# Patient Record
Sex: Male | Born: 1940 | Race: Black or African American | Hispanic: No | Marital: Married | Smoking: Former smoker
Health system: Southern US, Community
[De-identification: ages and names within clinical notes are randomized; demographics above are authoritative.]

## PROBLEM LIST (undated history)

## (undated) DIAGNOSIS — I10 Essential (primary) hypertension: Secondary | ICD-10-CM

## (undated) DIAGNOSIS — E119 Type 2 diabetes mellitus without complications: Secondary | ICD-10-CM

## (undated) HISTORY — PX: CARDIAC PACEMAKER PLACEMENT: SHX583

---

## 2005-05-06 ENCOUNTER — Ambulatory Visit: Payer: Self-pay | Admitting: Gastroenterology

## 2006-05-01 ENCOUNTER — Emergency Department: Payer: Self-pay | Admitting: Emergency Medicine

## 2006-06-15 ENCOUNTER — Emergency Department: Payer: Self-pay | Admitting: Emergency Medicine

## 2006-06-30 ENCOUNTER — Emergency Department: Payer: Self-pay | Admitting: Emergency Medicine

## 2006-07-08 ENCOUNTER — Emergency Department: Payer: Self-pay | Admitting: Emergency Medicine

## 2007-10-10 ENCOUNTER — Ambulatory Visit: Payer: Self-pay | Admitting: Urology

## 2008-04-28 ENCOUNTER — Emergency Department: Payer: Self-pay | Admitting: Emergency Medicine

## 2008-08-27 ENCOUNTER — Ambulatory Visit: Payer: Self-pay

## 2008-09-11 ENCOUNTER — Ambulatory Visit: Payer: Self-pay | Admitting: Gastroenterology

## 2009-09-16 ENCOUNTER — Emergency Department: Payer: Self-pay | Admitting: Emergency Medicine

## 2011-06-13 ENCOUNTER — Ambulatory Visit: Payer: Self-pay

## 2011-08-10 ENCOUNTER — Ambulatory Visit: Payer: Self-pay | Admitting: General Practice

## 2011-08-10 DIAGNOSIS — I10 Essential (primary) hypertension: Secondary | ICD-10-CM

## 2011-08-25 ENCOUNTER — Ambulatory Visit: Payer: Self-pay | Admitting: General Practice

## 2014-11-04 ENCOUNTER — Emergency Department: Payer: Self-pay | Admitting: Emergency Medicine

## 2014-11-04 LAB — URINALYSIS, COMPLETE
BACTERIA: NONE SEEN
BILIRUBIN, UR: NEGATIVE
Blood: NEGATIVE
Glucose,UR: NEGATIVE mg/dL (ref 0–75)
Ketone: NEGATIVE
Leukocyte Esterase: NEGATIVE
Nitrite: NEGATIVE
Ph: 7 (ref 4.5–8.0)
Protein: NEGATIVE
Specific Gravity: 1.018 (ref 1.003–1.030)
Squamous Epithelial: NONE SEEN
WBC UR: <1 /HPF (ref 0–5)

## 2014-11-04 LAB — COMPREHENSIVE METABOLIC PANEL
ALBUMIN: 3.7 g/dL (ref 3.4–5.0)
Alkaline Phosphatase: 79 U/L
Anion Gap: 5 — ABNORMAL LOW (ref 7–16)
BUN: 15 mg/dL (ref 7–18)
Bilirubin,Total: 0.4 mg/dL (ref 0.2–1.0)
CHLORIDE: 106 mmol/L (ref 98–107)
CREATININE: 1.03 mg/dL (ref 0.60–1.30)
Calcium, Total: 9 mg/dL (ref 8.5–10.1)
Co2: 29 mmol/L (ref 21–32)
EGFR (African American): >60
EGFR (Non-African Amer.): >60
Glucose: 139 mg/dL — ABNORMAL HIGH (ref 65–99)
OSMOLALITY: 282 (ref 275–301)
Potassium: 4.3 mmol/L (ref 3.5–5.1)
SGOT(AST): 31 U/L (ref 15–37)
SGPT (ALT): 36 U/L
Sodium: 140 mmol/L (ref 136–145)
Total Protein: 7.6 g/dL (ref 6.4–8.2)

## 2014-11-04 LAB — CBC WITH DIFFERENTIAL/PLATELET
BASOS ABS: 0.1 10*3/uL (ref 0.0–0.1)
Basophil %: 0.5 %
Eosinophil #: 0.1 10*3/uL (ref 0.0–0.7)
Eosinophil %: 1.1 %
HCT: 42.3 % (ref 40.0–52.0)
HGB: 13.7 g/dL (ref 13.0–18.0)
LYMPHS ABS: 1.4 10*3/uL (ref 1.0–3.6)
Lymphocyte %: 11.6 %
MCH: 29.2 pg (ref 26.0–34.0)
MCHC: 32.4 g/dL (ref 32.0–36.0)
MCV: 90 fL (ref 80–100)
MONO ABS: 0.8 x10 3/mm (ref 0.2–1.0)
Monocyte %: 6.6 %
Neutrophil #: 9.5 10*3/uL — ABNORMAL HIGH (ref 1.4–6.5)
Neutrophil %: 80.2 %
PLATELETS: 318 10*3/uL (ref 150–440)
RBC: 4.68 10*6/uL (ref 4.40–5.90)
RDW: 13.5 % (ref 11.5–14.5)
WBC: 11.9 10*3/uL — ABNORMAL HIGH (ref 3.8–10.6)

## 2014-11-04 LAB — LIPASE, BLOOD: LIPASE: 107 U/L (ref 73–393)

## 2014-11-18 ENCOUNTER — Ambulatory Visit: Payer: Self-pay | Admitting: Surgery

## 2014-11-18 LAB — CBC WITH DIFFERENTIAL/PLATELET
BASOS ABS: 0 10*3/uL (ref 0.0–0.1)
Basophil %: 0.6 %
EOS PCT: 3.4 %
Eosinophil #: 0.3 10*3/uL (ref 0.0–0.7)
HCT: 41 % (ref 40.0–52.0)
HGB: 13.4 g/dL (ref 13.0–18.0)
LYMPHS PCT: 23.1 %
Lymphocyte #: 1.9 10*3/uL (ref 1.0–3.6)
MCH: 29.7 pg (ref 26.0–34.0)
MCHC: 32.8 g/dL (ref 32.0–36.0)
MCV: 91 fL (ref 80–100)
MONOS PCT: 8.6 %
Monocyte #: 0.7 x10 3/mm (ref 0.2–1.0)
NEUTROS PCT: 64.3 %
Neutrophil #: 5.2 10*3/uL (ref 1.4–6.5)
PLATELETS: 287 10*3/uL (ref 150–440)
RBC: 4.52 10*6/uL (ref 4.40–5.90)
RDW: 13.4 % (ref 11.5–14.5)
WBC: 8.2 10*3/uL (ref 3.8–10.6)

## 2014-11-18 LAB — BASIC METABOLIC PANEL
ANION GAP: 6 — AB (ref 7–16)
BUN: 17 mg/dL (ref 7–18)
Calcium, Total: 8.6 mg/dL (ref 8.5–10.1)
Chloride: 106 mmol/L (ref 98–107)
Co2: 27 mmol/L (ref 21–32)
Creatinine: 0.91 mg/dL (ref 0.60–1.30)
GLUCOSE: 89 mg/dL (ref 65–99)
OSMOLALITY: 279 (ref 275–301)
Potassium: 4.2 mmol/L (ref 3.5–5.1)
Sodium: 139 mmol/L (ref 136–145)

## 2014-11-18 LAB — PROTIME-INR
INR: 1
PROTHROMBIN TIME: 13.8 s

## 2014-11-25 ENCOUNTER — Ambulatory Visit: Payer: Self-pay | Admitting: Surgery

## 2015-02-09 NOTE — Op Note (Signed)
PATIENT NAME:  Wesley Wilson, Wesley Wilson MR#:  [id number] DATE OF BIRTH:  28-Apr-1941  DATE OF PROCEDURE:  11/25/2014  PREOPERATIVE DIAGNOSIS: Recurrent right inguinal hernia.   POSTOPERATIVE DIAGNOSIS: Recurrent right inguinal hernia.   OPERATION: Robotic-assisted laparoscopic right inguinal hernia repair.   SURGEON: Quentin Orealph L. Ely, MD   ANESTHESIA: General.   OPERATIVE PROCEDURE: With the patient in the supine position, after the induction of appropriate general anesthesia, the patient's abdomen was prepped with ChloraPrep and draped with sterile towels. The patient was placed in the head down, feet up position. Wilson small supraumbilical incision was made in the standard fashion, carried down bluntly through the subcutaneous tissue. Wilson Veress needle was used to cannulate the peritoneal cavity. CO2 was insufflated to appropriate pressure measurements. When approximately 2.5 liters of CO2 were instilled, the Veress needle was withdrawn and an 8.5 mm robotic port was inserted into the peritoneal cavity. Intraperitoneal position was confirmed. CO2 was re-insufflated. Two 8.5 mm robotic ports were placed under direct vision laterally and Wilson 10 mm assistant port placed in the left upper quadrant, again under direct vision. The robot was brought to the table and appropriately docked to the patient and the instruments inserted under direct vision to the right lower quadrant. I then moved to the console. The hernia appeared to be an indirect hernia. The peritoneum was taken down laterally across the epigastric vessels to the medial umbilical fold. The peritoneum was then dissected back from the cord structures and from the [facility]. The patient had Wilson previous repair so the cord structures were skeletonized. The vessels and the vas deferens were identified. The peritoneum was dissected back into the abdominal cavity and the sac was reduced. Wilson piece of Bard 3-D Max mesh was brought to the table, inserted through the  assistant port and placed in the preperitoneal space. It was sutured in place using 3-0 Vicryl suture under direct vision. It was attached along Cooper ligament and the aponeurotic arch. The repair appeared to be satisfactory. The peritoneum was then placed back over the mesh separating it from the GI tract using self locking suture. The repair appeared to be satisfactory. The instruments were withdrawn without difficulty, the robot undocked and then the abdomen desufflated. Ports were withdrawn without difficulty. Skin incisions were closed with 5-0 nylon. The area was infiltrated with 0.25% Marcaine for postoperative pain control. Sterile dressings were applied. The patient was returned to the recovery room having tolerated the procedure well. Sponge, instrument and needle counts were correct x2 in the operating room. ____________________________ Quentin Orealph L. Ely III, MD rle:sb D: 11/25/2014 09:33:39 ET T: 11/25/2014 12:24:25 ET JOB#: [id number]  cc: Quentin Orealph L. Ely III, MD, <Dictator> Quentin OreALPH L ELY MD ELECTRONICALLY SIGNED 11/25/2014 17:42

## 2018-11-05 ENCOUNTER — Emergency Department
Admission: EM | Admit: 2018-11-05 | Discharge: 2018-11-05 | Disposition: A | Discharge status: Home / Self Care | Payer: Medicare HMO | Attending: Emergency Medicine | Admitting: Emergency Medicine

## 2018-11-05 ENCOUNTER — Emergency Department: Payer: Medicare HMO

## 2018-11-05 ENCOUNTER — Other Ambulatory Visit: Payer: Self-pay

## 2018-11-05 DIAGNOSIS — I10 Essential (primary) hypertension: Secondary | ICD-10-CM | POA: Insufficient documentation

## 2018-11-05 DIAGNOSIS — Z87891 Personal history of nicotine dependence: Secondary | ICD-10-CM | POA: Insufficient documentation

## 2018-11-05 DIAGNOSIS — J40 Bronchitis, not specified as acute or chronic: Secondary | ICD-10-CM | POA: Insufficient documentation

## 2018-11-05 DIAGNOSIS — R0602 Shortness of breath: Secondary | ICD-10-CM | POA: Diagnosis present

## 2018-11-05 DIAGNOSIS — E119 Type 2 diabetes mellitus without complications: Secondary | ICD-10-CM | POA: Diagnosis not present

## 2018-11-05 LAB — CBC WITH DIFFERENTIAL/PLATELET
Abs Immature Granulocytes: 0.03 10*3/uL (ref 0.00–0.07)
Basophils Absolute: 0.1 10*3/uL (ref 0.0–0.1)
Basophils Relative: 0 %
Eosinophils Absolute: 0.1 10*3/uL (ref 0.0–0.5)
Eosinophils Relative: 1 %
HCT: 41.3 % (ref 39.0–52.0)
HEMOGLOBIN: 13.4 g/dL (ref 13.0–17.0)
Immature Granulocytes: 0 %
Lymphocytes Relative: 14 %
Lymphs Abs: 1.7 10*3/uL (ref 0.7–4.0)
MCH: 29.1 pg (ref 26.0–34.0)
MCHC: 32.4 g/dL (ref 30.0–36.0)
MCV: 89.8 fL (ref 80.0–100.0)
Monocytes Absolute: 1.3 10*3/uL — ABNORMAL HIGH (ref 0.1–1.0)
Monocytes Relative: 11 %
Neutro Abs: 8.5 10*3/uL — ABNORMAL HIGH (ref 1.7–7.7)
Neutrophils Relative %: 74 %
Platelets: 215 10*3/uL (ref 150–400)
RBC: 4.6 MIL/uL (ref 4.22–5.81)
RDW: 12.7 % (ref 11.5–15.5)
WBC: 11.7 10*3/uL — ABNORMAL HIGH (ref 4.0–10.5)
nRBC: 0 % (ref 0.0–0.2)

## 2018-11-05 LAB — BASIC METABOLIC PANEL
Anion gap: 6 (ref 5–15)
BUN: 26 mg/dL — ABNORMAL HIGH (ref 8–23)
CALCIUM: 8.3 mg/dL — AB (ref 8.9–10.3)
CHLORIDE: 104 mmol/L (ref 98–111)
CO2: 25 mmol/L (ref 22–32)
Creatinine, Ser: 1.25 mg/dL — ABNORMAL HIGH (ref 0.61–1.24)
GFR calc Af Amer: >60 mL/min (ref >60)
GFR calc non Af Amer: 56 mL/min — ABNORMAL LOW (ref >60)
Glucose, Bld: 140 mg/dL — ABNORMAL HIGH (ref 70–99)
Potassium: 3.5 mmol/L (ref 3.5–5.1)
Sodium: 135 mmol/L (ref 135–145)

## 2018-11-05 LAB — TROPONIN I: Troponin I: <0.03 ng/mL (ref <0.03)

## 2018-11-05 MED ORDER — AMOXICILLIN-POT CLAVULANATE 875-125 MG PO TABS
1.0000 | ORAL_TABLET | Freq: Once | ORAL | Status: AC
  Administered 2018-11-05: 1 via ORAL
  Filled 2018-11-05: qty 1

## 2018-11-05 MED ORDER — ALBUTEROL SULFATE (2.5 MG/3ML) 0.083% IN NEBU
5.0000 mg | INHALATION_SOLUTION | Freq: Once | RESPIRATORY_TRACT | Status: AC
  Administered 2018-11-05: 5 mg via RESPIRATORY_TRACT
  Filled 2018-11-05: qty 6

## 2018-11-05 MED ORDER — PREDNISONE 20 MG PO TABS
60.0000 mg | ORAL_TABLET | Freq: Once | ORAL | Status: AC
  Administered 2018-11-05: 60 mg via ORAL
  Filled 2018-11-05: qty 3

## 2018-11-05 MED ORDER — PREDNISONE 20 MG PO TABS: 60.0000 mg | ORAL_TABLET | Freq: Every day | ORAL | 0 refills | Status: AC

## 2018-11-05 MED ORDER — ALBUTEROL SULFATE HFA 108 (90 BASE) MCG/ACT IN AERS: 2.0000 | INHALATION_SPRAY | Freq: Four times a day (QID) | RESPIRATORY_TRACT | 2 refills | Status: AC | PRN

## 2018-11-05 MED ORDER — AMOXICILLIN-POT CLAVULANATE 875-125 MG PO TABS: 1.0000 | ORAL_TABLET | Freq: Two times a day (BID) | ORAL | 0 refills | Status: AC

## 2018-11-05 MED ORDER — DOXYCYCLINE HYCLATE 100 MG PO CAPS: 100.0000 mg | ORAL_CAPSULE | Freq: Two times a day (BID) | ORAL | 0 refills | Status: AC

## 2018-11-05 MED ORDER — DOXYCYCLINE HYCLATE 100 MG PO TABS
100.0000 mg | ORAL_TABLET | Freq: Once | ORAL | Status: AC
  Administered 2018-11-05: 100 mg via ORAL
  Filled 2018-11-05: qty 1

## 2018-11-05 NOTE — ED Triage Notes (Signed)
Patient reports started coughing yesterday and producing yellow sputum.  Reports since last night has been very sore in upper chest that is worse with coughing.

## 2018-11-05 NOTE — ED Provider Notes (Signed)
[facility] Emergency Department Provider Note  ____________________________________________  Time seen: Approximately 10:30 PM  I have reviewed the triage vital signs and the nursing notes.   HISTORY  Chief Complaint Cough and Chest Pain   HPI Wesley Wilson is a 78 y.o. male with a history of diabetes and hypertension who presents for evaluation of cough and shortness of breath.  Patient reports cough productive of yellow sputum that started yesterday.  Patient is complaining of mild shortness of breath that is present with exertion.  He has had no fever but has had chills.  He is also complaining of a mild soreness in the center of his chest that is only present when he coughs.  No vomiting or diarrhea.  Patient is a former smoker but has no diagnosed history of COPD.  He denies personal or family history of blood clots, recent travel immobilization, leg pain or swelling, hemoptysis, exogenous hormones, or history of cancer.  No personal or family history of heart attacks.  PMH HTN DM  Allergies Patient has no known allergies.  FH Diabetes Brother    High blood pressure (Hypertension) Brother    Prostate cancer Brother    High blood pressure (Hypertension) Father    High blood pressure (Hypertension) Mother    Breast cancer Sister       Social History  Smoking - former Alcohol - yes Drugs - no  Review of Systems  Constitutional: Negative for fever. Eyes: Negative for visual changes. ENT: Negative for sore throat. Neck: No neck pain  Cardiovascular: + chest pain. Respiratory: + shortness of breath and cough Gastrointestinal: Negative for abdominal pain, vomiting or diarrhea. Genitourinary: Negative for dysuria. Musculoskeletal: Negative for back pain. Skin: Negative for rash. Neurological: Negative for headaches, weakness or numbness. Psych: No SI or HI  ____________________________________________   PHYSICAL  EXAM:  VITAL SIGNS: ED Triage Vitals  Enc Vitals Group     BP 11/05/18 1923 (!) 129/56     Pulse Rate 11/05/18 1923 95     Resp 11/05/18 1923 (!) 22     Temp 11/05/18 1923 98.6 F (37 C)     Temp Source 11/05/18 1923 Oral     SpO2 11/05/18 1923 94 %     Weight 11/05/18 1922 235 lb (106.6 kg)     Height 11/05/18 1922 6\' 4"  (1.93 m)     Head Circumference --      Peak Flow --      Pain Score 11/05/18 1922 3     Pain Loc --      Pain Edu? --      Excl. in GC? --     Constitutional: Alert and oriented. Well appearing and in no apparent distress. HEENT:      Head: Normocephalic and atraumatic.         Eyes: Conjunctivae are normal. Sclera is non-icteric.       Mouth/Throat: Mucous membranes are moist.       Neck: Supple with no signs of meningismus. Cardiovascular: Regular rate and rhythm. No murmurs, gallops, or rubs. 2+ symmetrical distal pulses are present in all extremities. No JVD. Respiratory: Tachypneic, normal respiratory effort, decreased air movement bilaterally with no crackles or wheezes Gastrointestinal: Soft, non tender, and non distended with positive bowel sounds. No rebound or guarding. Musculoskeletal: Nontender with normal range of motion in all extremities. No edema, cyanosis, or erythema of extremities. Neurologic: Normal speech and language. Face is symmetric. Moving all extremities. No gross  focal neurologic deficits are appreciated. Skin: Skin is warm, dry and intact. No rash noted. Psychiatric: Mood and affect are normal. Speech and behavior are normal.  ____________________________________________   LABS (all labs ordered are listed, but only abnormal results are displayed)  Labs Reviewed  CBC WITH DIFFERENTIAL/PLATELET - Abnormal; Notable for the following components:      Result Value   WBC 11.7 (*)    Neutro Abs 8.5 (*)    Monocytes Absolute 1.3 (*)    All other components within normal limits  BASIC METABOLIC PANEL - Abnormal; Notable for the  following components:   Glucose, Bld 140 (*)    BUN 26 (*)    Creatinine, Ser 1.25 (*)    Calcium 8.3 (*)    GFR calc non Af Amer 56 (*)    All other components within normal limits  TROPONIN I   ____________________________________________  EKG  ED ECG REPORT I, Nita Sickle, the attending physician, personally viewed and interpreted this ECG.   Normal sinus rhythm with bifascicular block, rate of 96, left axis deviation, no ST elevations or depressions, T wave inversions in anterior and lateral leads.  No significant changes when compared to prior. ____________________________________________  RADIOLOGY  I have personally reviewed the images performed during this visit and I agree with the Radiologist's read.   Interpretation by Radiologist:  Dg Chest 2 View  Result Date: 11/05/2018 CLINICAL DATA:  Productive cough EXAM: CHEST - 2 VIEW COMPARISON:  Report 09/11/1995 FINDINGS: Hyperinflation with apical pleural and parenchymal scarring. Possible lung nodules in the upper lobes. No pleural effusion. Normal heart size. Aortic atherosclerosis. No pneumothorax. IMPRESSION: 1. Hyperinflation with apical pleural and parenchymal scarring. No acute pulmonary infiltrate. 2. Possible lung nodules within the upper lobes. Chest CT is suggested for further evaluation. Electronically Signed   By: Jasmine Pang M.D.   On: 11/05/2018 19:58     ____________________________________________   PROCEDURES  Procedure(s) performed: None Procedures Critical Care performed:  None ____________________________________________   INITIAL IMPRESSION / ASSESSMENT AND PLAN / ED COURSE  78 y.o. male with a history of diabetes and hypertension who presents for evaluation of productive cough and shortness of breath.  Patient with normal work of breathing, slightly tachypneic, no wheezing or crackles.  Chest x-ray with no infiltrate or pulmonary edema.  Some incidental findings of lung nodules for which  patient would have to follow-up with his primary care doctor for further imaging.  EKG showed no acute ischemic changes.  Patient is afebrile, with no new oxygen requirement.  Mild leukocytosis with white count of 11.7.  Remaining of his labs are pending.  Presentation concerning for bronchitis versus early pneumonia.  Will treat with duo nebs, Solu-Medrol, and doxycycline/ augmentin    _________________________ 11:32 PM on 11/05/2018 -----------------------------------------  After DuoNeb patient feels markedly improved, no new oxygen requirement, moving good air.  Patient was started on doxycycline and Augmentin for possible early pneumonia versus bronchitis.  He was discharged home also on prednisone and albuterol.  Discussed close follow-up with primary care doctor and return precautions for chest pain, worsening shortness of breath or fever.   As part of my medical decision making, I reviewed the following data within the electronic MEDICAL RECORD NUMBER Nursing notes reviewed and incorporated, Labs reviewed , EKG interpreted , Old EKG reviewed, Old chart reviewed, Radiograph reviewed , Notes from prior ED visits and [place] Controlled Substance Database    Pertinent labs & imaging results that were available during my care of  the patient were reviewed by me and considered in my medical decision making (see chart for details).    ____________________________________________   FINAL CLINICAL IMPRESSION(S) / ED DIAGNOSES  Final diagnoses:  Bronchitis      NEW MEDICATIONS STARTED DURING THIS VISIT:  ED Discharge Orders         Ordered    albuterol (PROVENTIL HFA;VENTOLIN HFA) 108 (90 Base) MCG/ACT inhaler  Every 6 hours PRN     11/05/18 2303    predniSONE (DELTASONE) 20 MG tablet  Daily     11/05/18 2303    amoxicillin-clavulanate (AUGMENTIN) 875-125 MG tablet  2 times daily     11/05/18 2303    doxycycline (VIBRAMYCIN) 100 MG capsule  2 times daily     11/05/18 2303            Note:  This document was prepared using Dragon voice recognition software and may include unintentional dictation errors.    Nita Sickle, MD 11/05/18 [phone]

## 2018-11-05 NOTE — ED Notes (Signed)
First Nurse Note: Pt with c/o of chest congestion.

## 2018-11-24 ENCOUNTER — Other Ambulatory Visit: Payer: Self-pay | Admitting: Family Medicine

## 2018-11-24 ENCOUNTER — Ambulatory Visit
Admission: RE | Admit: 2018-11-24 | Discharge: 2018-11-24 | Disposition: A | Discharge status: Home / Self Care | Payer: Medicare HMO | Attending: Family Medicine | Admitting: Family Medicine

## 2018-11-24 ENCOUNTER — Ambulatory Visit
Admission: RE | Admit: 2018-11-24 | Discharge: 2018-11-24 | Disposition: A | Discharge status: Home / Self Care | Payer: Medicare HMO | Source: Ambulatory Visit | Attending: Family Medicine | Admitting: Family Medicine

## 2018-11-24 DIAGNOSIS — R05 Cough: Secondary | ICD-10-CM | POA: Insufficient documentation

## 2018-11-24 DIAGNOSIS — R059 Cough, unspecified: Secondary | ICD-10-CM

## 2018-12-06 ENCOUNTER — Other Ambulatory Visit: Payer: Self-pay | Admitting: *Deleted

## 2018-12-06 DIAGNOSIS — R911 Solitary pulmonary nodule: Secondary | ICD-10-CM

## 2018-12-07 ENCOUNTER — Other Ambulatory Visit: Payer: Self-pay | Admitting: Oncology

## 2018-12-07 DIAGNOSIS — R911 Solitary pulmonary nodule: Secondary | ICD-10-CM

## 2018-12-12 ENCOUNTER — Encounter (INDEPENDENT_AMBULATORY_CARE_PROVIDER_SITE_OTHER): Payer: Self-pay

## 2018-12-12 ENCOUNTER — Encounter: Payer: Self-pay | Admitting: Oncology

## 2018-12-12 ENCOUNTER — Ambulatory Visit
Admission: RE | Admit: 2018-12-12 | Discharge: 2018-12-12 | Disposition: A | Discharge status: Home / Self Care | Payer: Medicare HMO | Source: Ambulatory Visit | Attending: Oncology | Admitting: Oncology

## 2018-12-12 ENCOUNTER — Other Ambulatory Visit: Payer: Self-pay

## 2018-12-12 DIAGNOSIS — R911 Solitary pulmonary nodule: Secondary | ICD-10-CM

## 2018-12-12 NOTE — Progress Notes (Unsigned)
  Pulmonary Nodule Clinic Telephone Note  Received referral from PCP, Dr. Quillian Quince.   Per most recent guidelines and recommendations from Fleischner Society (2017), this patient requires a CT scan without contrast ASAP to evaluate abnormal findings from a chest x-ray found while being evalauted for a cough in the ED on 11/24/2018. He needs a follow-up visit in the pulmonary nodule clinic a few days later.    I have personally reviewed all patient's previous imaging. Last imaging completed on 11/24/18 revealed persistent nodular appearing foci at the left apex potentially scarring but pulmonary nodule not completely excluded.  Previous imaging with chest x-ray  from 11/05/18 revealed possible lung nodules within the upper lobes suggesting CT chest for further evaluation. Per Fleischner guidelines (2017), CT scan without contrast is recommended ASAP.    High risk factors include: History of heavy smoking, exposure to asbestos, radium or uranium, personal family history of lung cancer, older age, sex (females greater than males), race (black and native [place] greater than weight), marginal speculation, upper lobe location, multiplicity (less than 5 nodules increases risk for malignancy) and emphysema and/or pulmonary fibrosis.   This recommendation follows the consensus statement: Guidelines for Management of Incidental Pulmonary Nodules Detected on CT Images: From the Fleischner Society 2017; Radiology 2017; 284:228-243.    I have placed order for CT scan without contrast to be completed ASAP.  I would like him to see me in our Pulmonary Nodule Clinic after his CT scan on scheduled clinic day.  Scheduling has been notified of appointment request and will call patient with appointment   Durenda Hurt, NP 11/22/2018 2:22 PM

## 2018-12-13 ENCOUNTER — Inpatient Hospital Stay: Payer: Medicare HMO | Attending: Oncology | Admitting: Oncology

## 2018-12-13 VITALS — BP 159/91 | HR 79 | Temp 97.3°F | Resp 20

## 2018-12-13 DIAGNOSIS — E119 Type 2 diabetes mellitus without complications: Secondary | ICD-10-CM | POA: Insufficient documentation

## 2018-12-13 DIAGNOSIS — F419 Anxiety disorder, unspecified: Secondary | ICD-10-CM | POA: Diagnosis not present

## 2018-12-13 DIAGNOSIS — Z7984 Long term (current) use of oral hypoglycemic drugs: Secondary | ICD-10-CM | POA: Insufficient documentation

## 2018-12-13 DIAGNOSIS — I10 Essential (primary) hypertension: Secondary | ICD-10-CM | POA: Insufficient documentation

## 2018-12-13 DIAGNOSIS — J449 Chronic obstructive pulmonary disease, unspecified: Secondary | ICD-10-CM | POA: Insufficient documentation

## 2018-12-13 DIAGNOSIS — Z7982 Long term (current) use of aspirin: Secondary | ICD-10-CM

## 2018-12-13 DIAGNOSIS — R911 Solitary pulmonary nodule: Secondary | ICD-10-CM

## 2018-12-13 DIAGNOSIS — R918 Other nonspecific abnormal finding of lung field: Secondary | ICD-10-CM | POA: Diagnosis not present

## 2018-12-13 DIAGNOSIS — Z79899 Other long term (current) drug therapy: Secondary | ICD-10-CM | POA: Diagnosis not present

## 2018-12-13 DIAGNOSIS — Z87891 Personal history of nicotine dependence: Secondary | ICD-10-CM | POA: Diagnosis not present

## 2018-12-13 DIAGNOSIS — J441 Chronic obstructive pulmonary disease with (acute) exacerbation: Secondary | ICD-10-CM

## 2018-12-13 NOTE — Progress Notes (Signed)
Pulmonary Nodule Clinic Consult note [facility]  [phone] [phone]  Patient Care Team: Dortha Kern, MD as PCP - General (Family Medicine)   Name of the [name]  [id number]  January 15, 1941   Date of visit: 12/13/2018   Diagnosis- Question Pulmonary Nodule based on chest x-ray   Chief complaint/ Reason for visit- Pulmonary Nodule Clinic Initial Visit  PMH:  Patient is managed/referred by his PCP Dr. Quillian Quince for recommendations and follow-up of nodular appearance of left apical foci suggesting possible bilateral lung nodules during recent imaging.  Patient was initially evaluated for productive cough/SOB on 11/05/2018 and again on 11/24/2018 for follow-up revealing consistent above findings.  Given patient's underlying COPD and biapical scarring from emphysema, follow-up imaging with CT was recommended. He does not appear to have to have additional imaging for comparison.  Follow-up CT chest without contrast on 12/12/2018 revealed left apical densities (previously seen on chest x-ray), likely scarring and discrete worrisome pulmonary lesions.  Given underlying emphysema and history of smoking a follow-up noncontrasted CT is recommended in approximately 6 months to confirm stability.  No acute pulmonary findings or pleural effusion.  No mediastinal or hilar mass/adenopathy.  Interval history-Per medical records from [facility], patient has past medical history of alcoholism, hypertension, anxiety, history of colonic polyps and type 2 diabetes.  Family history consists of diabetes, hypertension, prostate cancer and breast cancer.  Past surgical history includes left and right hernia repair (1988 & 1993), right arthroscopic knee (08/25/2011) and tonsillectomy (1958).  Wife is deceased.  Patient currently is a non-smoker.  80-pack-year smoking history.  Admits to exposure to chemicals known to cause cancer.  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  Exposed to chemicals and dust.  Denies exposure to secondhand smoke and no personal history of cancer.  No known personal history of COPD or pulmonary fibrosis.  Patient most recently evaluated by Dr. Ernest Pine for unilateral osteoarthritis of the right knee. He received Monovisc injection.  Today, he admits to occasional shortness of breath. Denies cough and denies family history of lung cancer.  Based on lung screening risk assessment, patient is at high risk for the development of lung cancer. Denies any neurologic complaints. Denies recent fevers or illnesses. Denies any easy bleeding or bruising. Reports good appetite and denies weight loss. Denies chest pain. Denies any nausea, vomiting, constipation, or diarrhea. Denies urinary complaints.   Current medications listed below: . acetaminophen (TYLENOL) 500 MG tablet Take 500 mg by mouth every 6 (six) hours as needed for Pain.  Marland Kitchen aspirin 81 MG EC tablet Take 81 mg by mouth once daily.  Marland Kitchen atorvastatin (LIPITOR) 20 MG tablet Take 20 mg by mouth once daily. 12  . chlordiazePOXIDE (LIBRIUM) 10 MG capsule 10 mg. 1-2 capsules by mouth as needed.  Marland Kitchen glimepiride (AMARYL) 4 MG tablet Take 4 mg by mouth once daily. 12  . hydrochlorothiazide (HYDRODIURIL) 25 MG tablet TAKE 1 TABLET BY MOUTH EACH DAY 6  . JANUVIA 100 mg tablet Take 100 mg by mouth once daily 5  . lisinopril (PRINIVIL,ZESTRIL) 40 MG tablet Take 40 mg by mouth once daily.  . metFORMIN (GLUCOPHAGE-XR) 500 MG XR tablet 0  . metoprolol succinate (TOPROL-XL) 100 MG XL tablet TAKE 1 TABLET BY MOUTH EACH DAY 12  . metoprolol tartrate (LOPRESSOR) 50 MG tablet Take 50 mg by mouth once daily. 0  . multivit with min-FA-lycopene 0.4-600 mg-mcg Tab Take 1 tablet by  mouth once daily.  . tamsulosin (FLOMAX) 0.4 mg capsule Take 0.4 mg by mouth once daily. 12  . TRADJENTA 5 mg Tab Take 1 tablet by mouth once daily 5     ECOG FS:1 - Symptomatic but completely  ambulatory  Review of systems- Review of Systems  Constitutional: Positive for malaise/fatigue. Negative for chills, fever and weight loss.  HENT: Negative for congestion, ear pain and tinnitus.   Eyes: Negative.  Negative for blurred vision and double vision.  Respiratory: Positive for shortness of breath. Negative for cough and sputum production.   Cardiovascular: Negative.  Negative for chest pain, palpitations and leg swelling.  Gastrointestinal: Negative.  Negative for abdominal pain, constipation, diarrhea, nausea and vomiting.  Genitourinary: Negative for dysuria, frequency and urgency.  Musculoskeletal: Negative for back pain and falls.  Skin: Negative.  Negative for rash.  Neurological: Negative.  Negative for weakness and headaches.  Endo/Heme/Allergies: Negative.  Does not bruise/bleed easily.  Psychiatric/Behavioral: Negative.  Negative for depression. The patient is not nervous/anxious and does not have insomnia.      No Known Allergies   No past medical history on file.   Social History   Socioeconomic History  . Marital status: Married    Spouse name: Not on file  . Number of children: Not on file  . Years of education: Not on file  . Highest education level: Not on file  Occupational History  . Not on file  Social Needs  . Financial resource strain: Not on file  . Food insecurity:    Worry: Not on file    Inability: Not on file  . Transportation needs:    Medical: Not on file    Non-medical: Not on file  Tobacco Use  . Smoking status: Not on file  Substance and Sexual Activity  . Alcohol use: Not on file  . Drug use: Not on file  . Sexual activity: Not on file  Lifestyle  . Physical activity:    Days per week: Not on file    Minutes per session: Not on file  . Stress: Not on file  Relationships  . Social connections:    Talks on phone: Not on file    Gets together: Not on file    Attends religious service: Not on file    Active member of club  or organization: Not on file    Attends meetings of clubs or organizations: Not on file    Relationship status: Not on file  . Intimate partner violence:    Fear of current or ex partner: Not on file    Emotionally abused: Not on file    Physically abused: Not on file    Forced sexual activity: Not on file  Other Topics Concern  . Not on file  Social History Narrative  . Not on file    No family history on file.   Current Outpatient Medications:  .  albuterol (PROVENTIL HFA;VENTOLIN HFA) 108 (90 Base) MCG/ACT inhaler, Inhale 2 puffs into the lungs every 6 (six) hours as needed for wheezing or shortness of breath., Disp: 1 Inhaler, Rfl: 2 .  atorvastatin (LIPITOR) 20 MG tablet, Take by mouth., Disp: , Rfl:  .  metoprolol tartrate (LOPRESSOR) 50 MG tablet, Take by mouth., Disp: , Rfl:  .  acetaminophen (TYLENOL) 500 MG tablet, Take by mouth., Disp: , Rfl:  .  chlordiazePOXIDE (LIBRIUM) 10 MG capsule, Take 10 mg by mouth daily., Disp: , Rfl:  .  lisinopril (PRINIVIL,ZESTRIL) 40  MG tablet, Take 40 mg by mouth daily., Disp: , Rfl:  .  tamsulosin (FLOMAX) 0.4 MG CAPS capsule, TAKE TWO CAPSULES BY MOUTH EVERY EVENING, Disp: , Rfl:  .  TRULICITY 1.5 MG/0.5ML SOPN, INJECT 0.5MLS SUBCUTANEOUSLY ONCE A WEEK IN ABDOMEN, THIGH OR UPPER ARM. ROTATING INJECTION SITES, Disp: , Rfl:   Physical exam:  Vitals:   12/13/18 1025 12/13/18 1029  BP:  (!) 159/91  Pulse:  79  Resp: 20 20  Temp:  (!) 97.3 F (36.3 C)  TempSrc:  Tympanic  SpO2:  92%   Physical Exam Constitutional:      Appearance: Normal appearance.  HENT:     Head: Normocephalic and atraumatic.  Eyes:     Pupils: Pupils are equal, round, and reactive to light.  Neck:     Musculoskeletal: Normal range of motion.  Cardiovascular:     Rate and Rhythm: Normal rate and regular rhythm.     Heart sounds: Normal heart sounds. No murmur.  Pulmonary:     Effort: Pulmonary effort is normal.     Breath sounds: Normal breath sounds. No  wheezing.  Abdominal:     General: Bowel sounds are normal. There is no distension.     Palpations: Abdomen is soft.     Tenderness: There is no abdominal tenderness.  Musculoskeletal: Normal range of motion.  Skin:    General: Skin is warm and dry.     Findings: No rash.  Neurological:     Mental Status: He is alert and oriented to person, place, and time.  Psychiatric:        Judgment: Judgment normal.     CMP Latest Ref Rng & Units 11/05/2018  Glucose 70 - 99 mg/dL 153(P)  BUN 8 - 23 mg/dL 94(F)  Creatinine 2.76 - 1.24 mg/dL 1.47(W)  Sodium 929 - 574 mmol/L 135  Potassium 3.5 - 5.1 mmol/L 3.5  Chloride 98 - 111 mmol/L 104  CO2 22 - 32 mmol/L 25  Calcium 8.9 - 10.3 mg/dL 8.3(L)  Total Protein 6.4 - 8.2 g/dL -  Total Bilirubin 0.2 - 1.0 mg/dL -  Alkaline Phos Unit/L -  AST 15 - 37 Unit/L -  ALT U/L -   CBC Latest Ref Rng & Units 11/05/2018  WBC 4.0 - 10.5 K/uL 11.7(H)  Hemoglobin 13.0 - 17.0 g/dL 73.4  Hematocrit 03.7 - 52.0 % 41.3  Platelets 150 - 400 K/uL 215    No images are attached to the encounter.  Dg Chest 2 View  Result Date: 11/24/2018 CLINICAL DATA:  Cough, question lung nodules, follow-up bronchitis post treatment, still with productive cough and crackles on exam EXAM: CHEST - 2 VIEW COMPARISON:  At 1:20 03/31/2019 FINDINGS: Normal heart size, mediastinal contours, and pulmonary vascularity. Atherosclerotic calcification aorta. Emphysematous and bronchitic changes consistent with COPD. No acute infiltrate, pleural effusion or pneumothorax. Biapical scarring greater on RIGHT. Slightly nodular appearance of LEFT apical foci again identified. Questioned RIGHT upper lobe nodular focus may represent a prominent first costochondral junction. No acute osseous findings. Scattered degenerative disc disease changes and scoliosis of the thoracic spine. IMPRESSION: Changes of COPD with biapical scarring greater on RIGHT. Persistent somewhat nodular appearing foci at the LEFT  apex, potentially scarring but pulmonary nodule not completely excluded; follow-up CT chest recommended to exclude LEFT upper lobe nodules. Electronically Signed   By: Ulyses Southward M.D.   On: 11/24/2018 16:05   Ct Chest Wo Contrast  Result Date: 12/12/2018 CLINICAL DATA:  Followup left apical  density seen on recent x-ray. EXAM: CT CHEST WITHOUT CONTRAST TECHNIQUE: Multidetector CT imaging of the chest was performed following the standard protocol without IV contrast. COMPARISON:  11/24/2018 chest x-ray FINDINGS: Cardiovascular: The heart is normal in size. No pericardial effusion. There is mild tortuosity, ectasia and calcification of the thoracic aorta. Branch vessel calcifications are also noted. The left vertebral artery comes directly off the aorta and demonstrates moderate atherosclerotic calcifications. Scattered coronary artery calcifications. Mediastinum/Nodes: No mediastinal or hilar mass or lymphadenopathy. Scattered lymph nodes are noted. The esophagus is grossly normal. Lungs/Pleura: Advanced emphysematous changes with biapical pleural and parenchymal scarring changes. The left apical density is likely scar tissue and there appear to be some calcifications in it. I do not see a discrete worrisome pulmonary lesion. No infiltrates, edema or effusions. No interstitial lung disease Upper Abdomen: The upper abdomen is unremarkable. Moderate atherosclerotic calcifications involving the abdominal aorta and branch vessel ostia. Lateral position of the gallbladder is noted. Musculoskeletal: No chest wall mass, supraclavicular or axillary adenopathy. No acute bony findings or worrisome bone lesions. Moderate degenerative changes. IMPRESSION: 1. Advanced emphysematous changes with pulmonary scarring. 2. The left apical densities are likely scarring change. Given the underlying emphysema and history of smoking a follow-up noncontrast chest CT in 6 months is suggested to confirm stability. 3. No acute pulmonary  findings or pleural effusions. 4. No mediastinal or hilar mass or adenopathy. 5. Moderate atherosclerotic calcifications involving the aorta and coronary arteries. Aortic Atherosclerosis (ICD10-I70.0) and Emphysema (ICD10-J43.9). Electronically Signed   By: Rudie Meyer M.D.   On: 12/12/2018 15:53   Plan- Patient is a 78 y.o. male who presents for initial visit to pulmonary lung nodule clinic and to receive results from CT scan.  Biapical scarring and nodular appearing foci  noted on chest x-ray ordered by PCP for complaints pf cough and dyspnea on 11/05/18 and again on 11/24/18.  Recommendations were for CT chest without contrast to exclude left upper lobe nodules.  CT scan from 12/12/2018 revealed scarring and no evidence of lung nodules.   Disposition: Repeat noncontrast CT in approximately 6 months to ensure stability. RTC a few days later for results of scan, assessment and further recommendations.  Calculating malignancy probability of a pulmonary nodule: Risk factors include: 1.  Age. 2.  Cancer history. 3.  Diameter of pulmonary nodule and mm 4.  Location 5.  Smoking history 6.  Spiculation present.   Based on risk factors, this patient is high risk for the development of lung cancer even in absence of a lung nodule.  I would recommend a six-month follow-up with imaging to ensure stability given smoking history and age.  If imaging stable in 7-months, would recommend follow-up with annual low-dose CT screening.  Referral placed for low-dose screening program.  During our visit, we discussed pulmonary nodules are a common incidental finding and are often how lung cancer is discovered.  Lung cancer survival is directly related to the stage at diagnosis.  We discussed that nodules can vary in presentation from solitary pulmonary nodules to masses, 2 groundglass opacities and multiple nodules.  Pulmonary nodules in the majority of cases are benign but the probability of these becoming malignant  cannot be undermined.  Early identification of malignant nodules could lead to early diagnosis and increased survival.  We discussed the probability of pulmonary nodules becoming malignant increase with age, pack years of tobacco use, size/characteristics of the nodule and location; with upper lobe involvement being most worrisome.  We discussed the  goal of our clinic is to thoroughly evaluate each nodule, developed a comprehensive, individualized plan of care utilizing the most advanced technology and significantly reduce the time from detection to treatment.  A dedicated pulmonary nodule clinic has proven to indeed expedite the detection and treatment of lung cancer.  Patient education in fact sheet provided along with most recent CT scans.  He expressed interest in a referral to pulmonology given most recent COPD/bronchitis exacerbation.  Will reach out to his PCP for guidance.  CC: Dr. Quillian Quince, Dr. Sung Amabile  Visit Diagnosis 1. Abnormality of lung on CXR   2. Lung nodule   3. Chronic obstructive pulmonary disease with acute exacerbation (HCC)   4. Smoking history     Patient expressed understanding and was in agreement with this plan. He also understands that He can call clinic at any time with any questions, concerns, or complaints.   Greater than 50% was spent in counseling and coordination of care with this patient including but not limited to discussion of the relevant topics above (See A&P) including, but not limited to diagnosis and management of acute and chronic medical conditions.   Thank you for allowing me to participate in the care of this very pleasant patient.    Mauro Kaufmann, NP [facility] - [phone] Pager- [phone] 12/14/2018 11:05 AM

## 2019-06-08 ENCOUNTER — Telehealth: Payer: Self-pay | Admitting: *Deleted

## 2019-06-08 NOTE — Telephone Encounter (Signed)
Pt has been made aware of upcoming appts for follow up CT scan and follow up appt with Jennifer Burns, NP in the Lung Nodule Clinic. Pt verbalized understanding. Nothing further needed at this time. 

## 2019-06-14 ENCOUNTER — Other Ambulatory Visit: Payer: Self-pay

## 2019-06-14 ENCOUNTER — Ambulatory Visit: Payer: Medicare HMO | Admitting: Oncology

## 2019-06-14 ENCOUNTER — Ambulatory Visit
Admission: RE | Admit: 2019-06-14 | Discharge: 2019-06-14 | Disposition: A | Discharge status: Home / Self Care | Payer: Medicare HMO | Source: Ambulatory Visit | Attending: Oncology | Admitting: Oncology

## 2019-06-14 DIAGNOSIS — R911 Solitary pulmonary nodule: Secondary | ICD-10-CM | POA: Insufficient documentation

## 2019-06-15 ENCOUNTER — Inpatient Hospital Stay: Payer: Medicare HMO | Attending: Oncology | Admitting: Oncology

## 2019-06-15 DIAGNOSIS — Z87891 Personal history of nicotine dependence: Secondary | ICD-10-CM | POA: Diagnosis not present

## 2019-06-15 NOTE — Progress Notes (Signed)
Pulmonary Nodule Clinic Consult note [facility]  [phone] [phone]  Patient Care Team: Dortha Kern, MD as PCP - General (Family Medicine)   Name of the [name]  [id number]  08-17-1941   Date of visit: 06/15/2019   Diagnosis- Pulmonary Nodule follow-up  Chief complaint/ Reason for visit- Pulmonary Nodule Clinic Initial Visit  PMH: Patient is managed/referred by his PCP Dr. Quillian Quince for recommendations and follow-up of nodular appearance of left apical foci suggesting possible bilateral lung nodules during recent imaging.  Patient was initially evaluated for productive cough/SOB on 11/05/2018 and again on 11/24/2018 for follow-up revealing consistent above findings.  Given patient's underlying COPD and biapical scarring from emphysema, follow-up imaging with CT was recommended. He does not appear to have to have additional imaging for comparison.  CT chest without contrast on 12/12/2018 revealed left apical densities (previously seen on chest x-ray), likely scarring and discrete worrisome pulmonary lesions.  Given underlying emphysema and history of smoking a follow-up noncontrasted CT is recommended in approximately 6 months to confirm stability.  No acute pulmonary findings or pleural effusion.  No mediastinal or hilar mass/adenopathy.  Follow-up CT chest completed on 06/14/2019 reveals severe centriobular and paraseptal emphysema with apical bullae.  There are adjacent consolidations generally consistent with pleural parenchymal scarring and unchanged in comparison to prior examination on 12/12/2018.  Interval history-Per medical records from [facility], patient has past medical history of alcoholism, hypertension, anxiety, history of colonic polyps and type 2 diabetes.  Family history consists of diabetes, hypertension, prostate cancer and breast cancer.  Past surgical history includes left and right hernia repair (1988 & 1993),  right arthroscopic knee (08/25/2011) and tonsillectomy (1958).  Wife is deceased.  Patient currently is a non-smoker.  80-pack-year smoking history.  Admits to exposure to chemicals known to cause cancer.  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  Exposed to chemicals and dust.  Denies exposure to secondhand smoke and no personal history of cancer.  No known personal history of COPD or pulmonary fibrosis; although imaging shows severe emphysema and pulmonary scarring.  He uses an albuterol inhaler for rescue intermittently.  He see's Dr. Ernest Pine for unilateral osteoarthritis of the right knee. He received Monovisc injection.  Today, he admits to occasional shortness of breath mainly with exertion.  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  He uses his albuterol inhaler as needed.  He also has a part-time job working for a Warden/ranger.  He denies cough or shortness of breath.  Based on lung cancer screening risk assessment, he has a HIGH risk for the development of lung cancer given his age and smoking history.    Current medications listed below: . acetaminophen (TYLENOL) 500 MG tablet Take 500 mg by mouth every 6 (six) hours as needed for Pain.  Marland Kitchen aspirin 81 MG EC tablet Take 81 mg by mouth once daily.  Marland Kitchen atorvastatin (LIPITOR) 20 MG tablet Take 20 mg by mouth once daily. 12  . chlordiazePOXIDE (LIBRIUM) 10 MG capsule 10 mg. 1-2 capsules by mouth as needed.  Marland Kitchen glimepiride (AMARYL) 4 MG tablet Take 4 mg by mouth once daily. 12  . hydrochlorothiazide (HYDRODIURIL) 25 MG tablet TAKE 1 TABLET BY MOUTH EACH DAY 6  . JANUVIA 100 mg tablet Take 100 mg by mouth once daily 5  . lisinopril (PRINIVIL,ZESTRIL) 40 MG tablet Take 40 mg by mouth once daily.  Marland Kitchen  metFORMIN (GLUCOPHAGE-XR) 500 MG XR tablet 0  . metoprolol succinate (TOPROL-XL) 100 MG XL tablet TAKE 1 TABLET BY MOUTH EACH DAY 12  . metoprolol tartrate (LOPRESSOR) 50 MG  tablet Take 50 mg by mouth once daily. 0  . multivit with min-FA-lycopene 0.4-600 mg-mcg Tab Take 1 tablet by mouth once daily.  . tamsulosin (FLOMAX) 0.4 mg capsule Take 0.4 mg by mouth once daily. 12  . TRADJENTA 5 mg Tab Take 1 tablet by mouth once daily 5     ECOG FS:1 - Symptomatic but completely ambulatory  Review of systems- Review of Systems  Respiratory: Positive for shortness of breath (With exertion) and wheezing.   Musculoskeletal: Positive for joint pain.     No Known Allergies   No past medical history on file.   Social History   Socioeconomic History  . Marital status: Married    Spouse name: Not on file  . Number of children: Not on file  . Years of education: Not on file  . Highest education level: Not on file  Occupational History  . Not on file  Social Needs  . Financial resource strain: Not on file  . Food insecurity    Worry: Not on file    Inability: Not on file  . Transportation needs    Medical: Not on file    Non-medical: Not on file  Tobacco Use  . Smoking status: Not on file  Substance and Sexual Activity  . Alcohol use: Not on file  . Drug use: Not on file  . Sexual activity: Not on file  Lifestyle  . Physical activity    Days per week: Not on file    Minutes per session: Not on file  . Stress: Not on file  Relationships  . Social Musicianconnections    Talks on phone: Not on file    Gets together: Not on file    Attends religious service: Not on file    Active member of club or organization: Not on file    Attends meetings of clubs or organizations: Not on file    Relationship status: Not on file  . Intimate partner violence    Fear of current or ex partner: Not on file    Emotionally abused: Not on file    Physically abused: Not on file    Forced sexual activity: Not on file  Other Topics Concern  . Not on file  Social History Narrative  . Not on file    No family history on file.   Current Outpatient Medications:  .   acetaminophen (TYLENOL) 500 MG tablet, Take by mouth., Disp: , Rfl:  .  albuterol (PROVENTIL HFA;VENTOLIN HFA) 108 (90 Base) MCG/ACT inhaler, Inhale 2 puffs into the lungs every 6 (six) hours as needed for wheezing or shortness of breath., Disp: 1 Inhaler, Rfl: 2 .  atorvastatin (LIPITOR) 20 MG tablet, Take by mouth., Disp: , Rfl:  .  chlordiazePOXIDE (LIBRIUM) 10 MG capsule, Take 10 mg by mouth daily., Disp: , Rfl:  .  lisinopril (PRINIVIL,ZESTRIL) 40 MG tablet, Take 40 mg by mouth daily., Disp: , Rfl:  .  metoprolol tartrate (LOPRESSOR) 50 MG tablet, Take by mouth., Disp: , Rfl:  .  tamsulosin (FLOMAX) 0.4 MG CAPS capsule, TAKE TWO CAPSULES BY MOUTH EVERY EVENING, Disp: , Rfl:  .  TRULICITY 1.5 MG/0.5ML SOPN, INJECT 0.5MLS SUBCUTANEOUSLY ONCE A WEEK IN ABDOMEN, THIGH OR UPPER ARM. ROTATING INJECTION SITES, Disp: , Rfl:   Physical exam:  There were no vitals filed for this visit.   Limited due to telephone visit.   CMP Latest Ref Rng & Units 11/05/2018  Glucose 70 - 99 mg/dL 161(W140(H)  BUN 8 - 23 mg/dL 96(E26(H)  Creatinine 4.540.61 - 1.24 mg/dL 0.98(J1.25(H)  Sodium 191135 - 478145 mmol/L 135  Potassium 3.5 - 5.1 mmol/L 3.5  Chloride 98 - 111 mmol/L 104  CO2 22 - 32 mmol/L 25  Calcium 8.9 - 10.3 mg/dL 8.3(L)  Total Protein 6.4 - 8.2 g/dL -  Total Bilirubin 0.2 - 1.0 mg/dL -  Alkaline Phos Unit/L -  AST 15 - 37 Unit/L -  ALT U/L -   CBC Latest Ref Rng & Units 11/05/2018  WBC 4.0 - 10.5 K/uL 11.7(H)  Hemoglobin 13.0 - 17.0 g/dL 29.513.4  Hematocrit 62.139.0 - 52.0 % 41.3  Platelets 150 - 400 K/uL 215    No images are attached to the encounter.  Ct Chest Wo Contrast  Result Date: 06/14/2019 CLINICAL DATA:  Follow-up apical lung abnormalities on prior CT EXAM: CT CHEST WITHOUT CONTRAST TECHNIQUE: Multidetector CT imaging of the chest was performed following the standard protocol without IV contrast. COMPARISON:  12/12/2018 FINDINGS: Cardiovascular: Aortic atherosclerosis. Normal heart size. Scattered  three-vessel coronary artery calcifications. No pericardial effusion. Mediastinum/Nodes: No enlarged mediastinal, hilar, or axillary lymph nodes. Thyroid gland, trachea, and esophagus demonstrate no significant findings. Lungs/Pleura: Severe centrilobular and paraseptal emphysema with apical bullae. There are adjacent consolidations, generally consistent with pleural-parenchymal scarring and unchanged in comparison to prior examination dated 01/08/2019. No pleural effusion or pneumothorax. Upper Abdomen: No acute abnormality. Musculoskeletal: No chest wall mass or suspicious bone lesions identified. IMPRESSION: 1. Severe centrilobular and paraseptal emphysema with apical bullae. There are adjacent consolidations, generally consistent with pleural-parenchymal scarring and unchanged in comparison to prior examination dated 01/08/2019. 2.  Coronary artery disease and aortic atherosclerosis. Electronically Signed   By: Lauralyn PrimesAlex  Bibbey M.D.   On: 06/14/2019 14:21   Plan- Patient is a 78 y.o. male who presents to clinic for follow-up and to review most recent CT scan.  I personally reviewed his CT scan completed on 06/14/2019.  Imaging revealed severe centriobular and paraseptal emphysema with apical bullae.  There are adjacent consolidations, generally consistent with pleural parenchymal scarring and unchanged from comparison on 01/08/2019.  No evidence of pulmonary nodules.  Disposition: No continued follow-up needed with Pulmonary Lung Nodule Clinic.  I would recommend Low Dose CT screening program.  Referral Placed.   Calculating malignancy probability of a pulmonary nodule: Risk factors include: 1.  Age. 2.  Cancer history. 3.  Diameter of pulmonary nodule and mm 4.  Location 5.  Smoking history 6.  Spiculation present.   Based on risk factors, this patient is high risk for the development of lung cancer even in absence of a lung nodule.  Referral placed for low-dose screening program.  During our  visit, we discussed pulmonary nodules are a common incidental finding and are often how lung cancer is discovered.  Lung cancer survival is directly related to the stage at diagnosis.  We discussed that nodules can vary in presentation from solitary pulmonary nodules to masses, 2 groundglass opacities and multiple nodules.  Pulmonary nodules in the majority of cases are benign but the probability of these becoming malignant cannot be undermined.  Early identification of malignant nodules could lead to early diagnosis and increased survival.  We discussed the probability of pulmonary nodules becoming malignant increase with age, pack years of tobacco use, size/characteristics of  the nodule and location; with upper lobe involvement being most worrisome.  We discussed the goal of our clinic is to thoroughly evaluate each nodule, developed a comprehensive, individualized plan of care utilizing the most advanced technology and significantly reduce the time from detection to treatment.  A dedicated pulmonary nodule clinic has proven to indeed expedite the detection and treatment of lung cancer.  Patient education in fact sheet provided along with most recent CT scans.  He expressed interest in a referral to pulmonology given most recent COPD/bronchitis exacerbation.  Will reach out to his PCP for guidance.  Visit Diagnosis 1. Smoking history     Patient expressed understanding and was in agreement with this plan. He also understands that He can call clinic at any time with any questions, concerns, or complaints.   Greater than 50% was spent in counseling and coordination of care with this patient including but not limited to discussion of the relevant topics above (See A&P) including, but not limited to diagnosis and management of acute and chronic medical conditions.   Thank you for allowing me to participate in the care of this very pleasant patient.   Jacquelin Hawking, NP [place] [facility] Cell - [phone] Pager- [phone] 06/15/2019 10:21 AM   CC: Dr. Clemmie Krill

## 2020-06-24 ENCOUNTER — Telehealth: Payer: Self-pay

## 2020-06-24 NOTE — Telephone Encounter (Signed)
Contacted patient for lung CT screening program after receiving referral from Mignon Pine, NP.  Patient agreeable to program and CT scheduled for Tuesday, Oct 5 at 1:15.  Patient given address to imaging center.  Patient is a former smoker and stopped smoking in 1996.  He smoked 1.5 to 2 packs a day, he states that usually it was closer to 1.5 packs.  He started smoking at age 78.

## 2020-06-25 NOTE — Telephone Encounter (Signed)
Upon review of chart, noted patient quit date > 15 years. Patient contacted and confirmed quit date 1996. Patient made aware that he does not meet eligiblity requirement for lung screening due to this. Verbalizes understanding. Will send to pulmonary nodule clinic to assess for need of further imaging.

## 2020-06-26 NOTE — Telephone Encounter (Signed)
Wesley Wilson- do you want to continue to follow patient. Last CT was stable and he was discharged from the lung nodule clinic at that time.

## 2020-06-27 NOTE — Telephone Encounter (Signed)
FYI- Wesley Wilson

## 2020-06-27 NOTE — Telephone Encounter (Signed)
No I don't think he needs any additional follow-up.  Durenda Hurt, NP 06/27/2020 10:18 AM

## 2020-10-28 IMAGING — CT CT CHEST W/O CM
1 series · 15 of 34 positions shown, 19 images · non-contrast
Comparison: 11/24/2018 chest x-ray

CLINICAL DATA: Followup left apical density seen on recent x-ray.

EXAM:
CT CHEST WITHOUT CONTRAST
TECHNIQUE: Multidetector CT imaging of the chest was performed following the
standard protocol without IV contrast.

[Series 2: thorax · axial · 0.83mm/px · z∈[-620,-300]mm · 15 of 188 slices shown, 19 images]
[im 14/188  mediastinal]
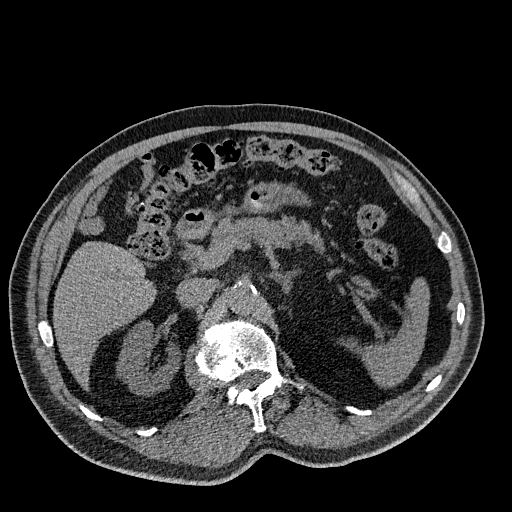
[im 14/188  lung]
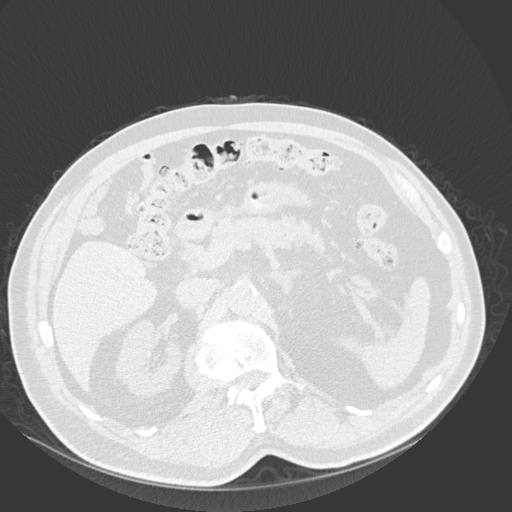
[im 28/188  lung]
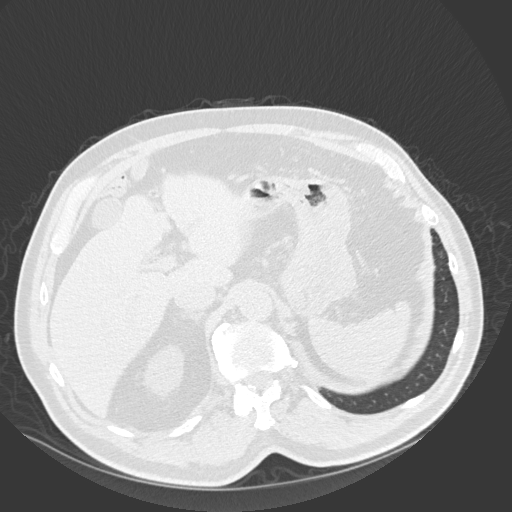
[im 38/188  lung]
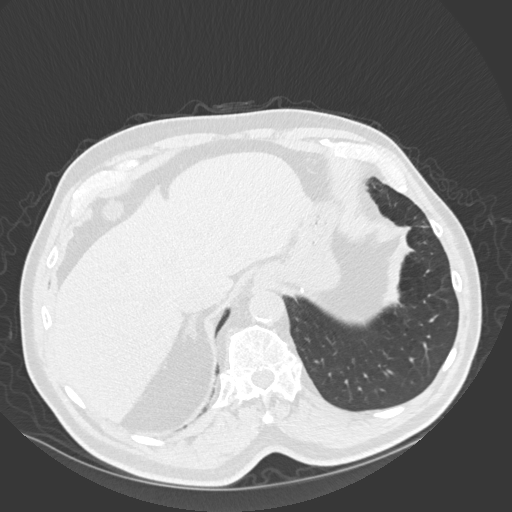
[im 49/188  lung]
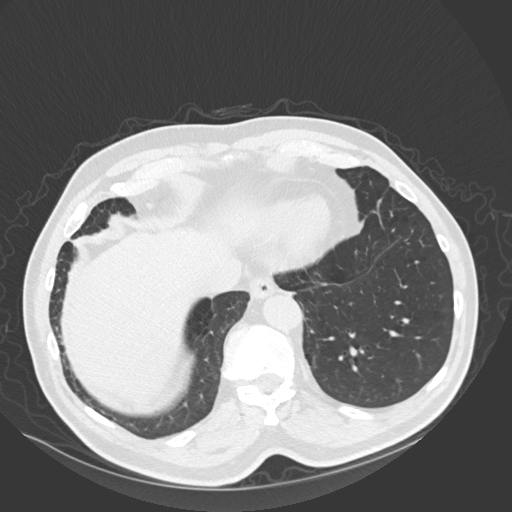
[im 63/188  mediastinal]
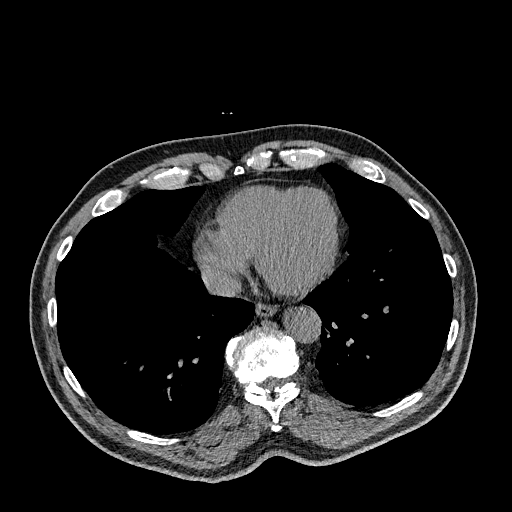
[im 63/188  lung]
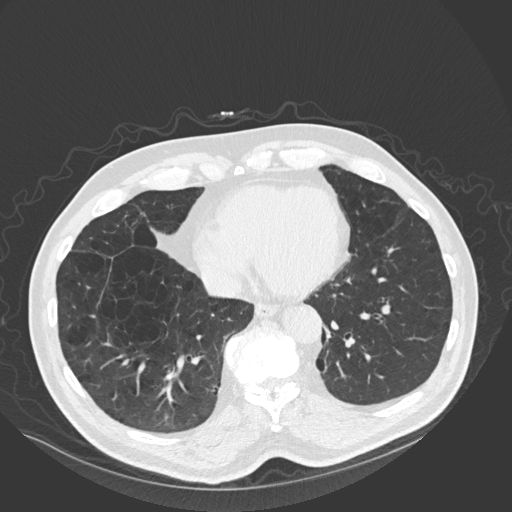
[im 75/188  lung]
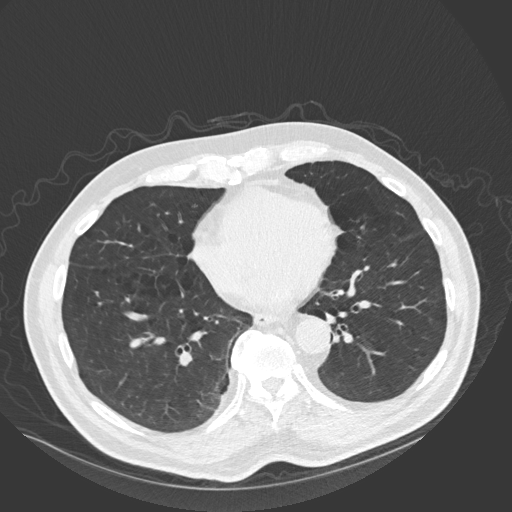
[im 84/188  lung]
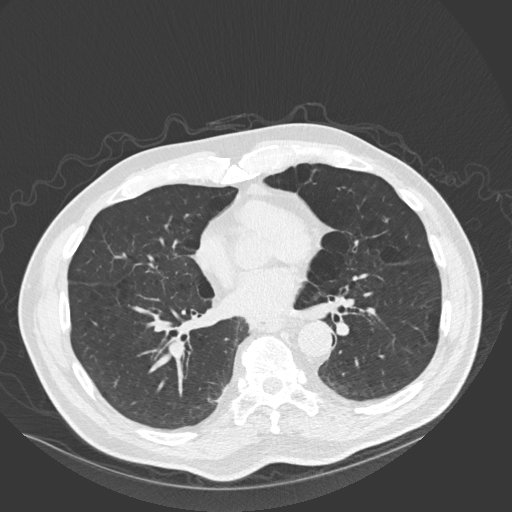
[im 97/188  lung]
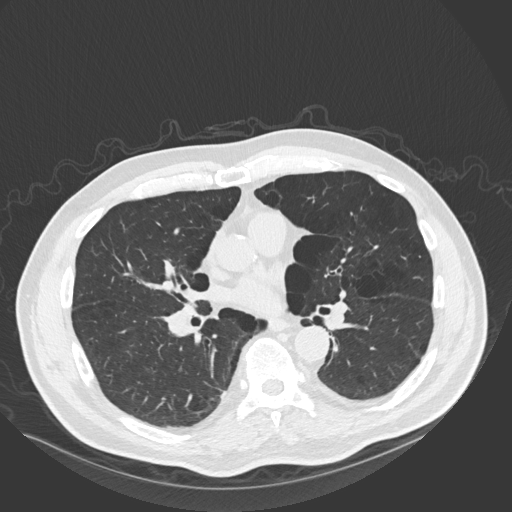
[im 104/188  mediastinal]
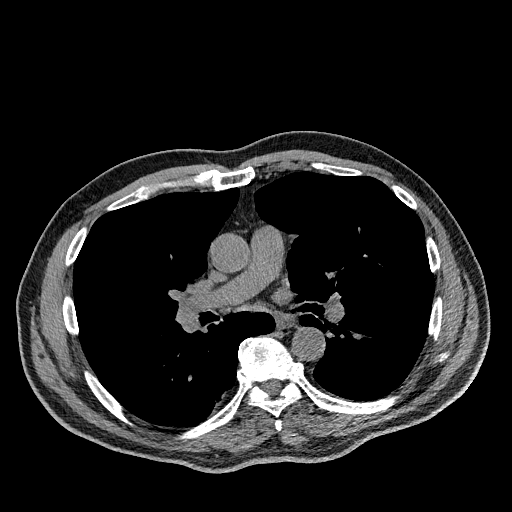
[im 104/188  lung]
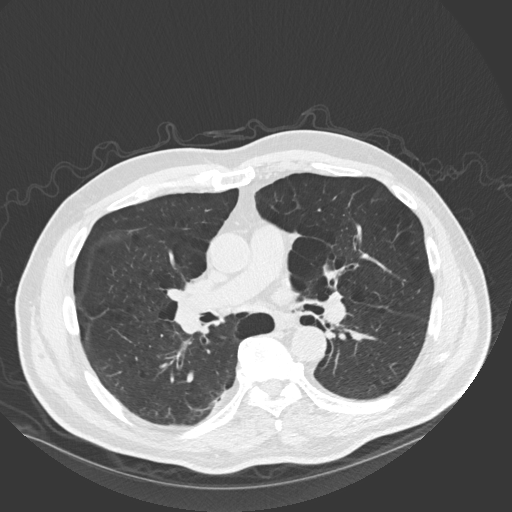
[im 113/188  lung]
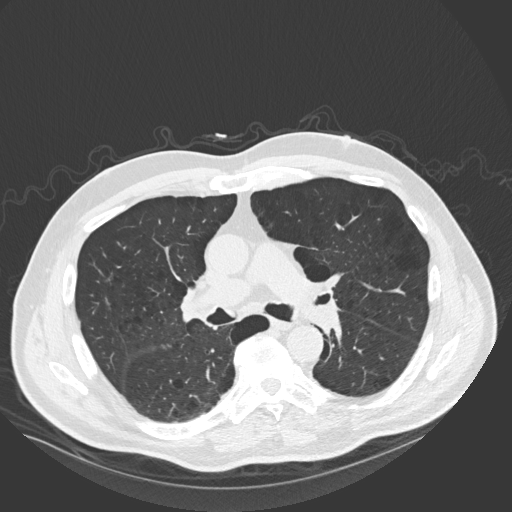
[im 125/188  lung]
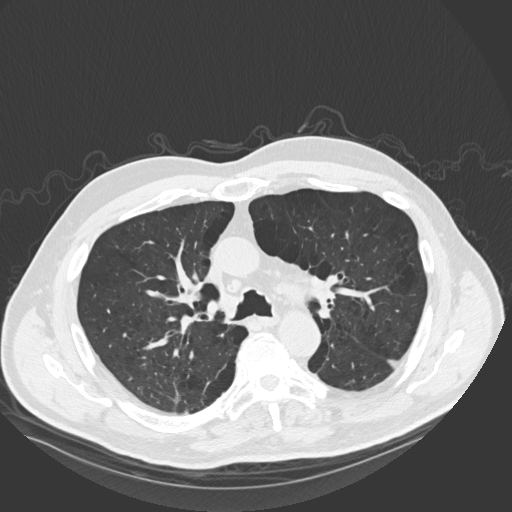
[im 139/188  lung]
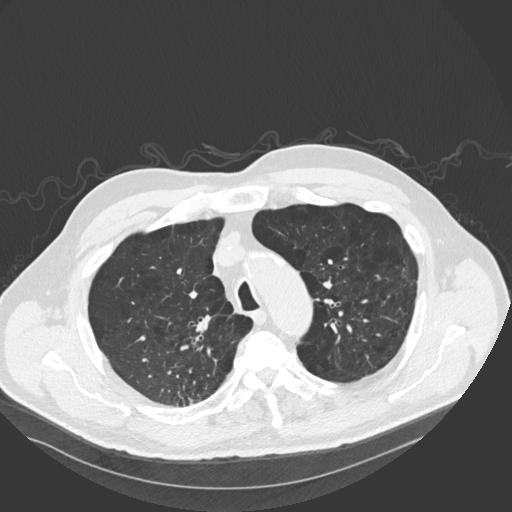
[im 150/188  mediastinal]
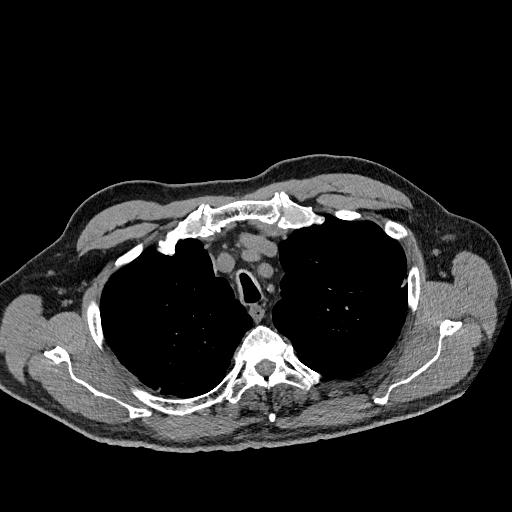
[im 150/188  lung]
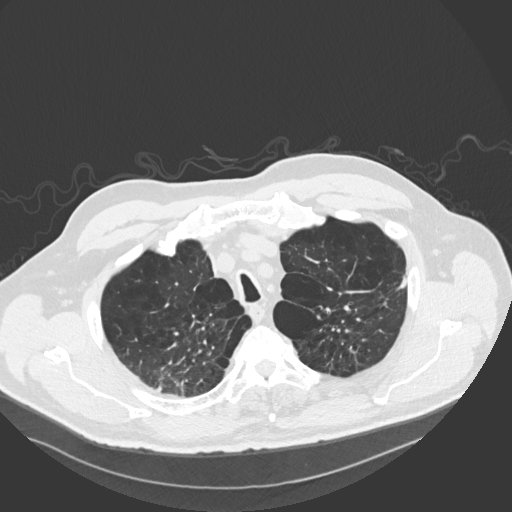
[im 160/188  lung]
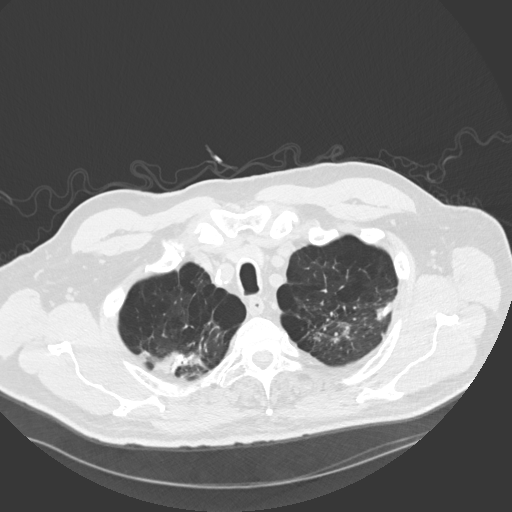
[im 174/188  lung]
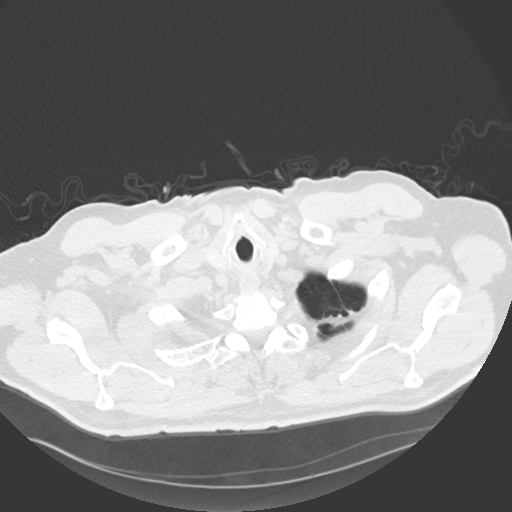

[15 of 34 positions shown; findings below may reference images not displayed]

FINDINGS: Cardiovascular: The heart is normal in size. No pericardial
effusion. There is mild tortuosity, ectasia and calcification of the
thoracic aorta. Branch vessel calcifications are also noted. The
left vertebral artery comes directly off the aorta and demonstrates
moderate atherosclerotic calcifications. Scattered coronary artery
calcifications.

Mediastinum/Nodes: No mediastinal or hilar mass or lymphadenopathy.
Scattered lymph nodes are noted. The esophagus is grossly normal.

Lungs/Pleura: Advanced emphysematous changes with biapical pleural
and parenchymal scarring changes. The left apical density is likely
scar tissue and there appear to be some calcifications in it. I do
not see a discrete worrisome pulmonary lesion. No infiltrates, edema
or effusions. No interstitial lung disease

Upper Abdomen: The upper abdomen is unremarkable. Moderate
atherosclerotic calcifications involving the abdominal aorta and
branch vessel ostia. Lateral position of the gallbladder is noted.

Musculoskeletal: No chest wall mass, supraclavicular or axillary
adenopathy.

No acute bony findings or worrisome bone lesions. Moderate
degenerative changes.
IMPRESSION: 1. Advanced emphysematous changes with pulmonary scarring.
2. The left apical densities are likely scarring change. Given the
underlying emphysema and history of smoking a follow-up noncontrast
chest CT in 6 months is suggested to confirm stability.
3. No acute pulmonary findings or pleural effusions.
4. No mediastinal or hilar mass or adenopathy.
5. Moderate atherosclerotic calcifications involving the aorta and
coronary arteries.

Aortic Atherosclerosis (MICRK-2FT.T) and Emphysema (MICRK-IZL.4).

## 2020-11-17 DIAGNOSIS — F1011 Alcohol abuse, in remission: Secondary | ICD-10-CM | POA: Insufficient documentation

## 2020-11-17 DIAGNOSIS — I10 Essential (primary) hypertension: Secondary | ICD-10-CM | POA: Diagnosis present

## 2020-11-17 DIAGNOSIS — E119 Type 2 diabetes mellitus without complications: Secondary | ICD-10-CM

## 2021-04-30 IMAGING — CT CT CHEST W/O CM
2 of 4 series · 15 of 36 positions shown, 18 images · non-contrast
Comparison: 12/12/2018

CLINICAL DATA: Follow-up apical lung abnormalities on prior CT

EXAM:
CT CHEST WITHOUT CONTRAST
TECHNIQUE: Multidetector CT imaging of the chest was performed following the
standard protocol without IV contrast.

[Series 2: axial chest · axial · 0.78mm/px · z∈[-1259,-923]mm · 12 of 200 slices shown, 15 images]
[im 16/200  mediastinal]
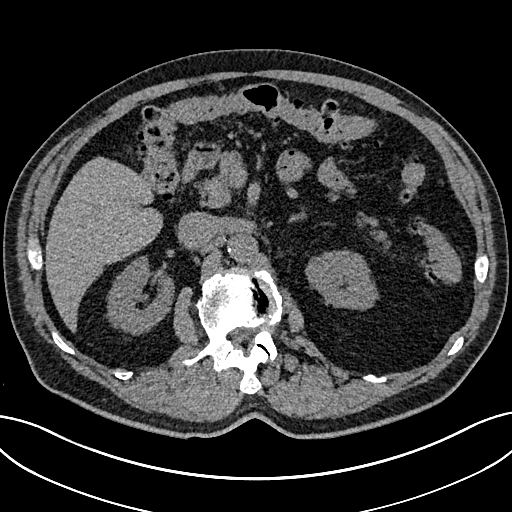
[im 16/200  lung]
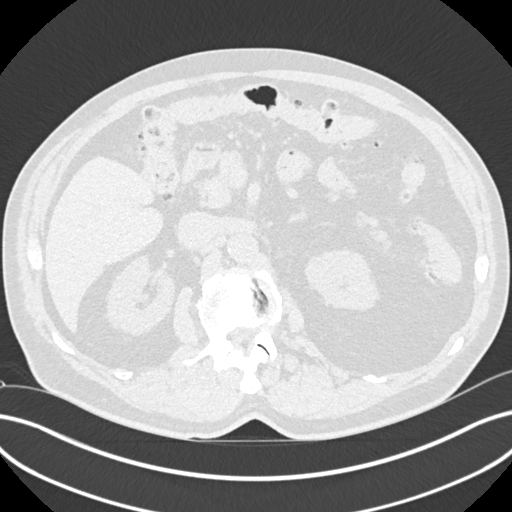
[im 31/200  lung]
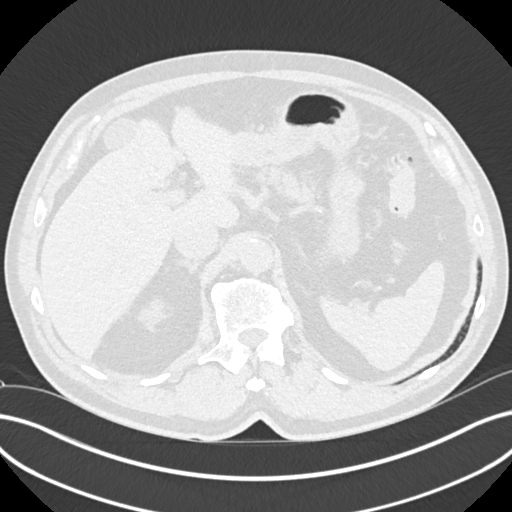
[im 46/200  lung]
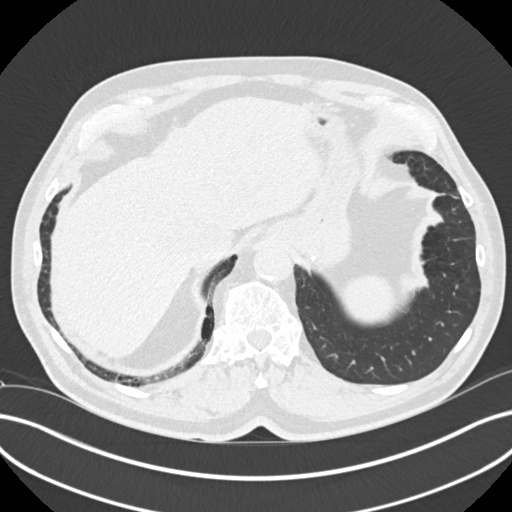
[im 62/200  lung]
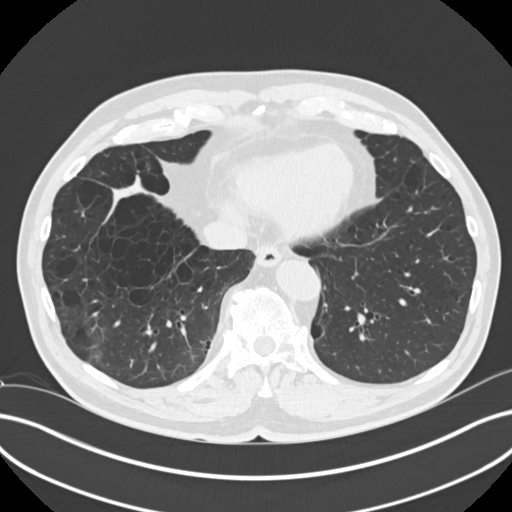
[im 77/200  mediastinal]
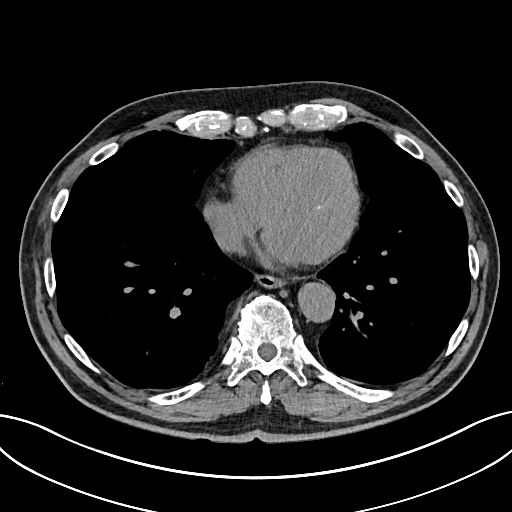
[im 77/200  lung]
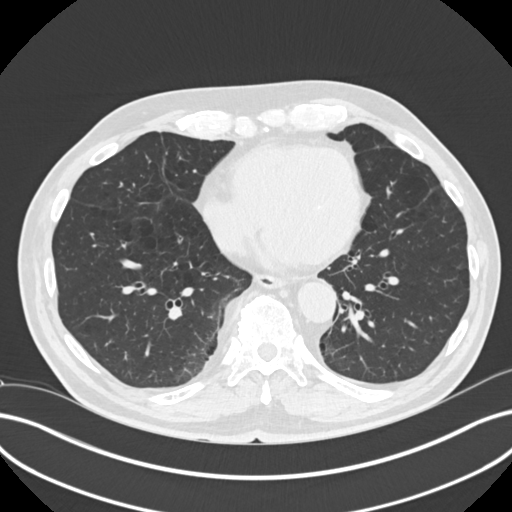
[im 92/200  lung]
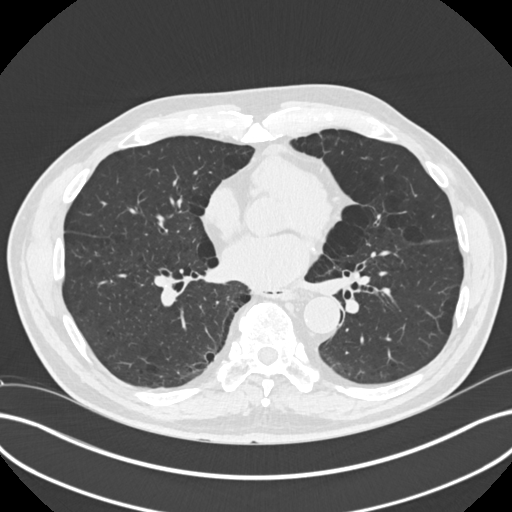
[im 108/200  lung]
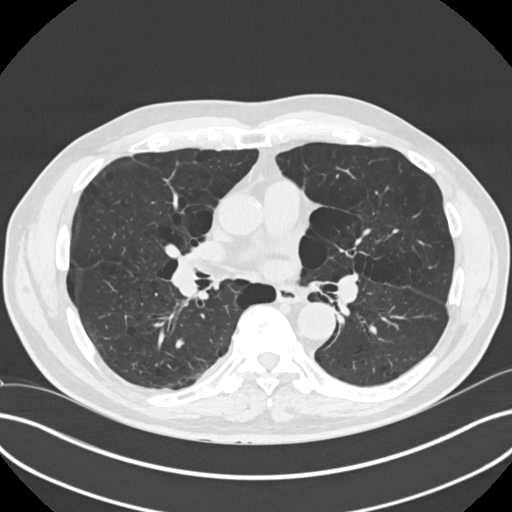
[im 123/200  lung]
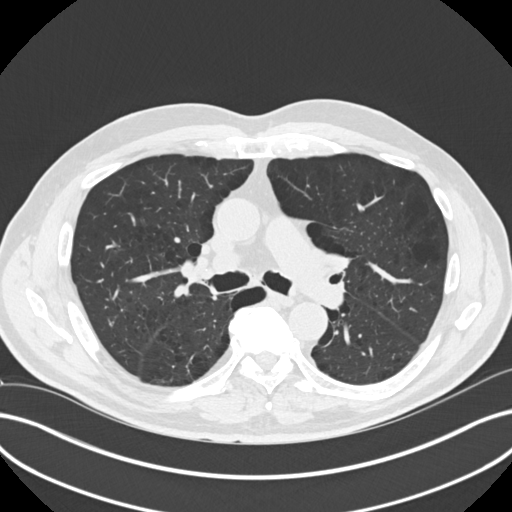
[im 138/200  mediastinal]
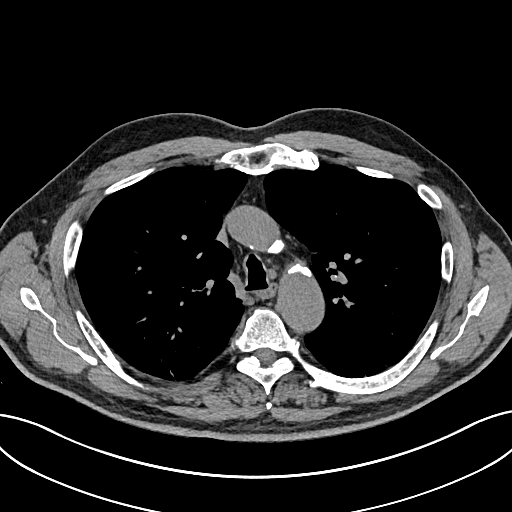
[im 138/200  lung]
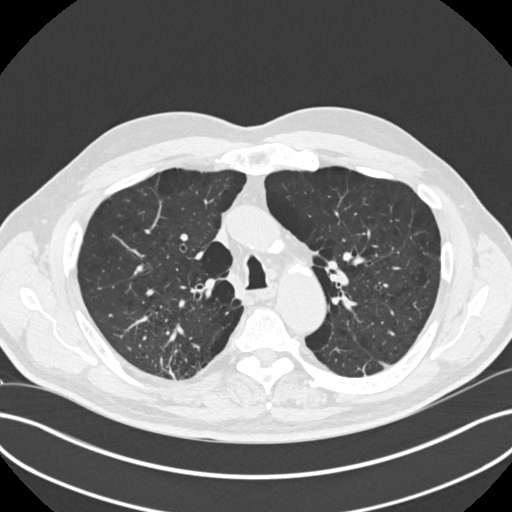
[im 154/200  lung]
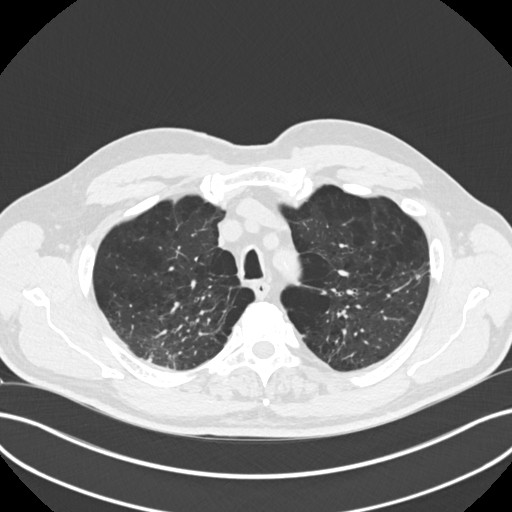
[im 169/200  lung]
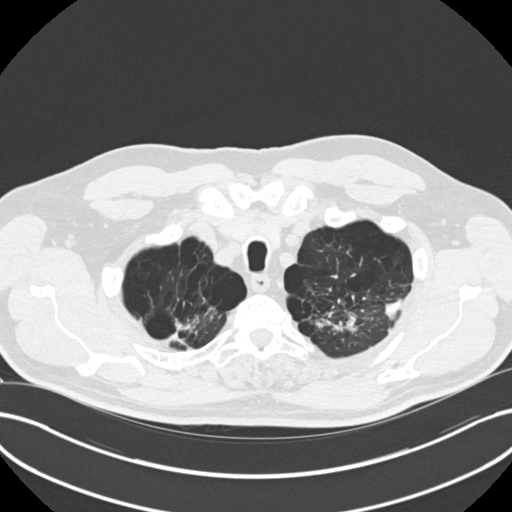
[im 184/200  lung]
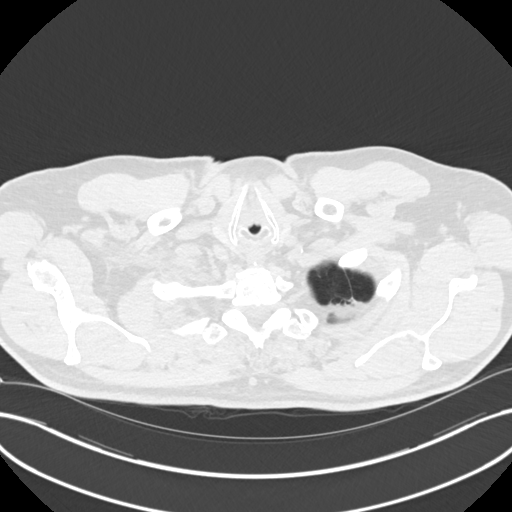

[Series 5: coronal chest · coronal · 0.78mm/px · 3 of 154 slices shown]
[im 31/154  lung]
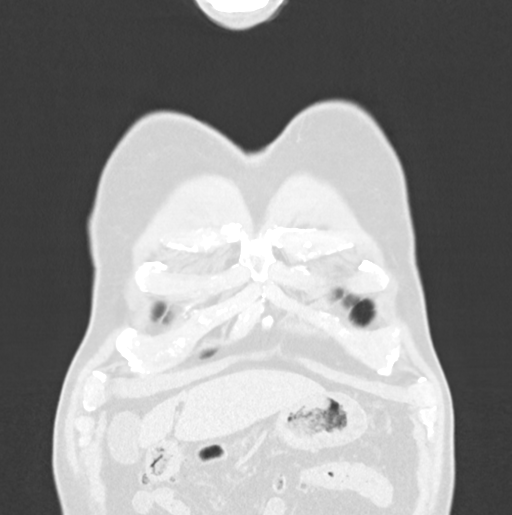
[im 62/154  lung]
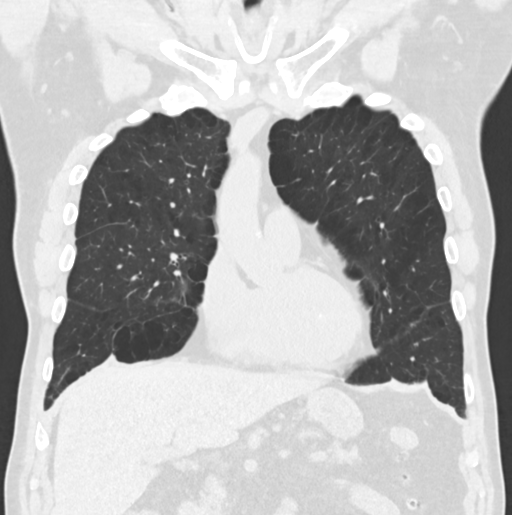
[im 92/154  lung]
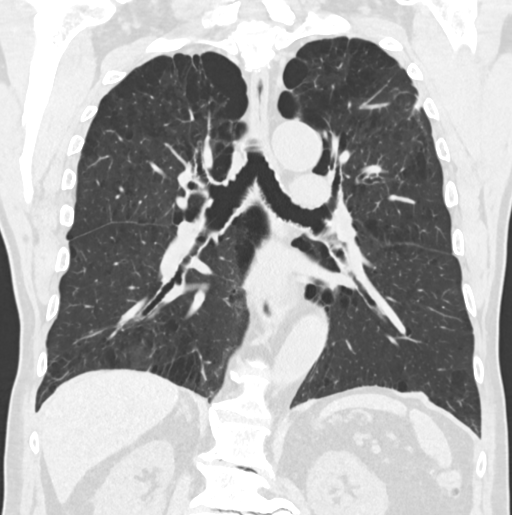

[15 of 36 positions shown; findings below may reference images not displayed]

FINDINGS: Cardiovascular: Aortic atherosclerosis. Normal heart size. Scattered
three-vessel coronary artery calcifications. No pericardial
effusion.

Mediastinum/Nodes: No enlarged mediastinal, hilar, or axillary lymph
nodes. Thyroid gland, trachea, and esophagus demonstrate no
significant findings.

Lungs/Pleura: Severe centrilobular and paraseptal emphysema with
apical bullae. There are adjacent consolidations, generally
consistent with pleural-parenchymal scarring and unchanged in
comparison to prior examination dated 01/08/2019. No pleural
effusion or pneumothorax.

Upper Abdomen: No acute abnormality.

Musculoskeletal: No chest wall mass or suspicious bone lesions
identified.
IMPRESSION: 1. Severe centrilobular and paraseptal emphysema with apical bullae.
There are adjacent consolidations, generally consistent with
pleural-parenchymal scarring and unchanged in comparison to prior
examination dated 01/08/2019.

[DATE].  Coronary artery disease and aortic atherosclerosis.

## 2023-02-07 ENCOUNTER — Other Ambulatory Visit (INDEPENDENT_AMBULATORY_CARE_PROVIDER_SITE_OTHER): Payer: Self-pay | Admitting: Vascular Surgery

## 2023-02-07 DIAGNOSIS — I739 Peripheral vascular disease, unspecified: Secondary | ICD-10-CM

## 2023-02-08 ENCOUNTER — Encounter (INDEPENDENT_AMBULATORY_CARE_PROVIDER_SITE_OTHER): Payer: Medicare HMO | Admitting: Vascular Surgery

## 2023-02-08 ENCOUNTER — Encounter (INDEPENDENT_AMBULATORY_CARE_PROVIDER_SITE_OTHER): Payer: Self-pay

## 2023-03-23 ENCOUNTER — Encounter (INDEPENDENT_AMBULATORY_CARE_PROVIDER_SITE_OTHER): Payer: Medicare HMO | Admitting: Nurse Practitioner

## 2023-03-23 ENCOUNTER — Encounter (INDEPENDENT_AMBULATORY_CARE_PROVIDER_SITE_OTHER): Payer: Self-pay

## 2023-04-23 DIAGNOSIS — J9601 Acute respiratory failure with hypoxia: Secondary | ICD-10-CM | POA: Diagnosis present

## 2023-05-01 ENCOUNTER — Inpatient Hospital Stay
Admission: EM | Admit: 2023-05-01 | Discharge: 2023-05-04 | DRG: 193 | Disposition: A | Discharge status: Home Health | Payer: Medicare HMO | Attending: Internal Medicine | Admitting: Internal Medicine

## 2023-05-01 ENCOUNTER — Emergency Department: Payer: Medicare HMO

## 2023-05-01 ENCOUNTER — Other Ambulatory Visit: Payer: Self-pay

## 2023-05-01 DIAGNOSIS — I447 Left bundle-branch block, unspecified: Secondary | ICD-10-CM | POA: Diagnosis present

## 2023-05-01 DIAGNOSIS — J440 Chronic obstructive pulmonary disease with acute lower respiratory infection: Secondary | ICD-10-CM | POA: Diagnosis present

## 2023-05-01 DIAGNOSIS — I10 Essential (primary) hypertension: Secondary | ICD-10-CM | POA: Diagnosis present

## 2023-05-01 DIAGNOSIS — J441 Chronic obstructive pulmonary disease with (acute) exacerbation: Secondary | ICD-10-CM | POA: Diagnosis present

## 2023-05-01 DIAGNOSIS — Z79899 Other long term (current) drug therapy: Secondary | ICD-10-CM | POA: Diagnosis not present

## 2023-05-01 DIAGNOSIS — Z7985 Long-term (current) use of injectable non-insulin antidiabetic drugs: Secondary | ICD-10-CM | POA: Diagnosis not present

## 2023-05-01 DIAGNOSIS — R531 Weakness: Secondary | ICD-10-CM | POA: Diagnosis not present

## 2023-05-01 DIAGNOSIS — R338 Other retention of urine: Secondary | ICD-10-CM | POA: Diagnosis not present

## 2023-05-01 DIAGNOSIS — I4729 Other ventricular tachycardia: Secondary | ICD-10-CM

## 2023-05-01 DIAGNOSIS — I472 Ventricular tachycardia, unspecified: Secondary | ICD-10-CM | POA: Diagnosis present

## 2023-05-01 DIAGNOSIS — R300 Dysuria: Secondary | ICD-10-CM | POA: Diagnosis present

## 2023-05-01 DIAGNOSIS — J189 Pneumonia, unspecified organism: Secondary | ICD-10-CM | POA: Diagnosis not present

## 2023-05-01 DIAGNOSIS — E119 Type 2 diabetes mellitus without complications: Secondary | ICD-10-CM | POA: Diagnosis present

## 2023-05-01 DIAGNOSIS — J449 Chronic obstructive pulmonary disease, unspecified: Secondary | ICD-10-CM | POA: Diagnosis not present

## 2023-05-01 DIAGNOSIS — Z87891 Personal history of nicotine dependence: Secondary | ICD-10-CM | POA: Diagnosis not present

## 2023-05-01 DIAGNOSIS — J188 Other pneumonia, unspecified organism: Secondary | ICD-10-CM | POA: Diagnosis present

## 2023-05-01 DIAGNOSIS — J9601 Acute respiratory failure with hypoxia: Secondary | ICD-10-CM | POA: Diagnosis present

## 2023-05-01 DIAGNOSIS — D649 Anemia, unspecified: Secondary | ICD-10-CM | POA: Diagnosis present

## 2023-05-01 DIAGNOSIS — R339 Retention of urine, unspecified: Secondary | ICD-10-CM | POA: Diagnosis not present

## 2023-05-01 DIAGNOSIS — E785 Hyperlipidemia, unspecified: Secondary | ICD-10-CM | POA: Diagnosis present

## 2023-05-01 DIAGNOSIS — Z950 Presence of cardiac pacemaker: Secondary | ICD-10-CM | POA: Diagnosis not present

## 2023-05-01 DIAGNOSIS — N400 Enlarged prostate without lower urinary tract symptoms: Secondary | ICD-10-CM

## 2023-05-01 DIAGNOSIS — R54 Age-related physical debility: Secondary | ICD-10-CM | POA: Diagnosis present

## 2023-05-01 DIAGNOSIS — Y95 Nosocomial condition: Secondary | ICD-10-CM | POA: Diagnosis present

## 2023-05-01 DIAGNOSIS — N401 Enlarged prostate with lower urinary tract symptoms: Secondary | ICD-10-CM | POA: Diagnosis present

## 2023-05-01 DIAGNOSIS — Z8619 Personal history of other infectious and parasitic diseases: Secondary | ICD-10-CM | POA: Diagnosis not present

## 2023-05-01 LAB — URINALYSIS, W/ REFLEX TO CULTURE (INFECTION SUSPECTED)
Bacteria, UA: NONE SEEN
Bilirubin Urine: NEGATIVE
Glucose, UA: >=500 mg/dL — AB
Hgb urine dipstick: NEGATIVE
Ketones, ur: NEGATIVE mg/dL
Leukocytes,Ua: NEGATIVE
Nitrite: NEGATIVE
Protein, ur: NEGATIVE mg/dL
Specific Gravity, Urine: 1.031 — ABNORMAL HIGH (ref 1.005–1.030)
Squamous Epithelial / HPF: NONE SEEN /HPF (ref 0–5)
pH: 6 (ref 5.0–8.0)

## 2023-05-01 LAB — CBC WITH DIFFERENTIAL/PLATELET
Abs Immature Granulocytes: 0.08 10*3/uL — ABNORMAL HIGH (ref 0.00–0.07)
Basophils Absolute: 0 10*3/uL (ref 0.0–0.1)
Basophils Relative: 0 %
Eosinophils Absolute: 0 10*3/uL (ref 0.0–0.5)
Eosinophils Relative: 0 %
HCT: 36.5 % — ABNORMAL LOW (ref 39.0–52.0)
Hemoglobin: 12.2 g/dL — ABNORMAL LOW (ref 13.0–17.0)
Immature Granulocytes: 1 %
Lymphocytes Relative: 2 %
Lymphs Abs: 0.2 10*3/uL — ABNORMAL LOW (ref 0.7–4.0)
MCH: 29.4 pg (ref 26.0–34.0)
MCHC: 33.4 g/dL (ref 30.0–36.0)
MCV: 88 fL (ref 80.0–100.0)
Monocytes Absolute: 0.4 10*3/uL (ref 0.1–1.0)
Monocytes Relative: 3 %
Neutro Abs: 13 10*3/uL — ABNORMAL HIGH (ref 1.7–7.7)
Neutrophils Relative %: 94 %
Platelets: 344 10*3/uL (ref 150–400)
RBC: 4.15 MIL/uL — ABNORMAL LOW (ref 4.22–5.81)
RDW: 14.7 % (ref 11.5–15.5)
WBC: 13.7 10*3/uL — ABNORMAL HIGH (ref 4.0–10.5)
nRBC: 0 % (ref 0.0–0.2)

## 2023-05-01 LAB — PROTIME-INR
INR: 1.3 — ABNORMAL HIGH (ref 0.8–1.2)
Prothrombin Time: 16 seconds — ABNORMAL HIGH (ref 11.4–15.2)

## 2023-05-01 LAB — COMPREHENSIVE METABOLIC PANEL
ALT: 49 U/L — ABNORMAL HIGH (ref 0–44)
AST: 20 U/L (ref 15–41)
Albumin: 2.7 g/dL — ABNORMAL LOW (ref 3.5–5.0)
Alkaline Phosphatase: 90 U/L (ref 38–126)
Anion gap: 7 (ref 5–15)
BUN: 19 mg/dL (ref 8–23)
CO2: 21 mmol/L — ABNORMAL LOW (ref 22–32)
Calcium: 7.7 mg/dL — ABNORMAL LOW (ref 8.9–10.3)
Chloride: 106 mmol/L (ref 98–111)
Creatinine, Ser: 1.16 mg/dL (ref 0.61–1.24)
GFR, Estimated: >60 mL/min (ref >60)
Glucose, Bld: 222 mg/dL — ABNORMAL HIGH (ref 70–99)
Potassium: 3.7 mmol/L (ref 3.5–5.1)
Sodium: 134 mmol/L — ABNORMAL LOW (ref 135–145)
Total Bilirubin: 0.9 mg/dL (ref 0.3–1.2)
Total Protein: 6.2 g/dL — ABNORMAL LOW (ref 6.5–8.1)

## 2023-05-01 LAB — CBG MONITORING, ED: Glucose-Capillary: 179 mg/dL — ABNORMAL HIGH (ref 70–99)

## 2023-05-01 LAB — LACTIC ACID, PLASMA: Lactic Acid, Venous: 1.1 mmol/L (ref 0.5–1.9)

## 2023-05-01 MED ORDER — LISINOPRIL 20 MG PO TABS
40.0000 mg | ORAL_TABLET | Freq: Every day | ORAL | Status: DC
  Administered 2023-05-02 – 2023-05-04 (×2): 40 mg via ORAL
  Filled 2023-05-01 (×2): qty 2

## 2023-05-01 MED ORDER — TAMSULOSIN HCL 0.4 MG PO CAPS: 0.4000 mg | ORAL_CAPSULE | Freq: Every day | ORAL | Status: DC

## 2023-05-01 MED ORDER — TAMSULOSIN HCL 0.4 MG PO CAPS
0.4000 mg | ORAL_CAPSULE | Freq: Every day | ORAL | Status: DC
  Administered 2023-05-01: 0.4 mg via ORAL
  Filled 2023-05-01: qty 1

## 2023-05-01 MED ORDER — INSULIN ASPART 100 UNIT/ML IJ SOLN
0.0000 [IU] | Freq: Three times a day (TID) | INTRAMUSCULAR | Status: DC
  Administered 2023-05-02: 5 [IU] via SUBCUTANEOUS
  Administered 2023-05-02: 3 [IU] via SUBCUTANEOUS
  Administered 2023-05-02: 2 [IU] via SUBCUTANEOUS
  Administered 2023-05-03: 3 [IU] via SUBCUTANEOUS
  Administered 2023-05-03: 8 [IU] via SUBCUTANEOUS
  Administered 2023-05-04: 11 [IU] via SUBCUTANEOUS
  Administered 2023-05-04: 5 [IU] via SUBCUTANEOUS
  Filled 2023-05-01 (×7): qty 1

## 2023-05-01 MED ORDER — ACETAMINOPHEN 325 MG PO TABS
650.0000 mg | ORAL_TABLET | Freq: Four times a day (QID) | ORAL | Status: DC | PRN
  Administered 2023-05-04: 650 mg via ORAL
  Filled 2023-05-01: qty 2

## 2023-05-01 MED ORDER — SODIUM CHLORIDE 0.9 % IV SOLN
1.0000 g | Freq: Once | INTRAVENOUS | Status: AC
  Administered 2023-05-01: 1 g via INTRAVENOUS
  Filled 2023-05-01: qty 10

## 2023-05-01 MED ORDER — HYDROCODONE-ACETAMINOPHEN 5-325 MG PO TABS
1.0000 | ORAL_TABLET | ORAL | Status: DC | PRN
  Administered 2023-05-03 (×3): 1 via ORAL
  Filled 2023-05-01 (×4): qty 1

## 2023-05-01 MED ORDER — GUAIFENESIN ER 600 MG PO TB12
600.0000 mg | ORAL_TABLET | Freq: Two times a day (BID) | ORAL | Status: DC
  Administered 2023-05-01 – 2023-05-04 (×6): 600 mg via ORAL
  Filled 2023-05-01 (×6): qty 1

## 2023-05-01 MED ORDER — ACETAMINOPHEN 650 MG RE SUPP: 650.0000 mg | Freq: Four times a day (QID) | RECTAL | Status: DC | PRN

## 2023-05-01 MED ORDER — SODIUM CHLORIDE 0.9 % IV SOLN
500.0000 mg | Freq: Once | INTRAVENOUS | Status: AC
  Administered 2023-05-01: 500 mg via INTRAVENOUS
  Filled 2023-05-01: qty 5

## 2023-05-01 MED ORDER — ONDANSETRON HCL 4 MG/2ML IJ SOLN: 4.0000 mg | Freq: Four times a day (QID) | INTRAMUSCULAR | Status: DC | PRN

## 2023-05-01 MED ORDER — ENOXAPARIN SODIUM 40 MG/0.4ML IJ SOSY
40.0000 mg | PREFILLED_SYRINGE | INTRAMUSCULAR | Status: DC
  Administered 2023-05-01 – 2023-05-03 (×3): 40 mg via SUBCUTANEOUS
  Filled 2023-05-01 (×3): qty 0.4

## 2023-05-01 MED ORDER — ONDANSETRON HCL 4 MG PO TABS: 4.0000 mg | ORAL_TABLET | Freq: Four times a day (QID) | ORAL | Status: DC | PRN

## 2023-05-01 MED ORDER — ATORVASTATIN CALCIUM 20 MG PO TABS
20.0000 mg | ORAL_TABLET | Freq: Every day | ORAL | Status: DC
  Administered 2023-05-01 – 2023-05-04 (×4): 20 mg via ORAL
  Filled 2023-05-01 (×4): qty 1

## 2023-05-01 MED ORDER — METOPROLOL TARTRATE 50 MG PO TABS
50.0000 mg | ORAL_TABLET | Freq: Two times a day (BID) | ORAL | Status: AC
  Administered 2023-05-01 – 2023-05-02 (×2): 50 mg via ORAL
  Filled 2023-05-01 (×2): qty 1

## 2023-05-01 MED ORDER — INSULIN ASPART 100 UNIT/ML IJ SOLN
0.0000 [IU] | Freq: Every day | INTRAMUSCULAR | Status: DC
  Administered 2023-05-02: 2 [IU] via SUBCUTANEOUS
  Administered 2023-05-03: 4 [IU] via SUBCUTANEOUS
  Filled 2023-05-01 (×2): qty 1

## 2023-05-01 NOTE — Assessment & Plan Note (Addendum)
Continue Lantus 40 U Sliding scale coverage

## 2023-05-01 NOTE — ED Notes (Signed)
Pt was able to void approx , bladder scan done post void urine left in bladder.

## 2023-05-01 NOTE — Assessment & Plan Note (Addendum)
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  Treated empirically with doxycycline for 7 day course.  For repeat titer around 8/1-8/7.

## 2023-05-01 NOTE — H&P (Signed)
History and Physical    [name] ZOX:[id number] DOB: 1941/06/28 DOA: 05/01/2023 DOS: the patient was seen and examined on 05/01/2023 PCP: Dortha Kern, MD  Patient coming from: Home  Chief Complaint:  Chief Complaint  Patient presents with   Dysuria   Weakness    HPI: Wesley Wilson is a 82 y.o. male with medical history significant for intermittent AV block s/p PPM, NSVT, HTN, HLD, BPH,, T2DM (on trulicity), prior alcohol use, and prior smoker (80-pack year) , hospitalized at [facility] from 7/12 to 7/19 for with respiratory failure secondary to COPD exacerbation, requiring 2 to 3 L, weaned off O2 prior to discharge, who presents to the ED with weakness and a fall, high blood sugars at home as well as urinary retention.  He denied abdominal pain, fever or chills, nausea or vomiting or diarrhea..  During his recent stay he had myalgias and conjunctivitis and was RMSF positive(titer of 1: 128) and discharged with doxycycline.  During workup in the ED, a postvoid residual was done with 525 mL. While in the ED he dropped his O2 sat to the mid 80s and was placed on O2 at 2L ED course and data review: On arrival afebrile, BP 123/67, tachycardic to 103, tachypneic to 24 with O2 sat 87% on room air.  Labs with WBC 13,000, hemoglobin 12, up from 11.2 at discharge on 7/16, lactic acid 1.1.  Troponin and BNP not done.  Urinalysis unremarkable.EKG, Personally viewed and interpreted showing sinus rhythm at 98 with LBBB Chest x-ray consistent with pneumonia as follows: IMPRESSION: 1. New patchy airspace opacities in both lower lungs concerning for pneumonia. 2. Stable pleural-parenchymal scarring/opacities.  Patient started on Rocephin and azithromycin He had a Foley placed in the ED Hospitalist consulted for admission.   Review of Systems: As mentioned in the history of present illness. All other systems reviewed and are negative.    Prior to Admission medications   Medication Sig  Start Date End Date Taking? Authorizing Provider  acetaminophen (TYLENOL) 500 MG tablet Take by mouth.    [provider]  albuterol (PROVENTIL HFA;VENTOLIN HFA) 108 (90 Base) MCG/ACT inhaler Inhale 2 puffs into the lungs every 6 (six) hours as needed for wheezing or shortness of breath. 11/05/18   Don Perking, [place], MD  atorvastatin (LIPITOR) 20 MG tablet Take by mouth. 02/02/16   [provider]  chlordiazePOXIDE (LIBRIUM) 10 MG capsule Take 10 mg by mouth daily. 12/04/18   [provider]  lisinopril (PRINIVIL,ZESTRIL) 40 MG tablet Take 40 mg by mouth daily. 12/04/18   [provider]  metoprolol tartrate (LOPRESSOR) 50 MG tablet Take by mouth. 09/23/16   [provider]  tamsulosin (FLOMAX) 0.4 MG CAPS capsule TAKE TWO CAPSULES BY MOUTH EVERY EVENING 12/04/18   [provider]  TRULICITY 1.5 MG/0.5ML SOPN INJECT 0.5MLS SUBCUTANEOUSLY ONCE A WEEK IN ABDOMEN, THIGH OR UPPER ARM. ROTATING INJECTION SITES 11/20/18   [provider]    Physical Exam: Vitals:   05/01/23 1744 05/01/23 1744 05/01/23 1800 05/01/23 1900  BP:   137/70 125/71  Pulse:   94 93  Resp:   (!) 26 (!) 23  Temp: 98.4 F (36.9 C)     TempSrc: Oral     SpO2:  95% 92% 92%  Weight:      Height:       Physical Exam  Labs on Admission: I have personally reviewed following labs and imaging studies  CBC: Recent Labs  Lab 05/01/23 1756  WBC 13.7*  NEUTROABS 13.0*  HGB 12.2*  HCT 36.5*  MCV 88.0  PLT 344   Basic Metabolic Panel: Recent Labs  Lab 05/01/23 1756  NA 134*  K 3.7  CL 106  CO2 21*  GLUCOSE 222*  BUN 19  CREATININE 1.16  CALCIUM 7.7*   GFR: Estimated Creatinine Clearance: 60.7 mL/min (by C-G formula based on SCr of 1.16 mg/dL). Liver Function Tests: Recent Labs  Lab 05/01/23 1756  AST 20  ALT 49*  ALKPHOS 90  BILITOT 0.9  PROT 6.2*  ALBUMIN 2.7*   No results for input(s): "LIPASE", "AMYLASE" in the last 168 hours. No results  for input(s): "AMMONIA" in the last 168 hours. Coagulation Profile: Recent Labs  Lab 05/01/23 1756  INR 1.3*   Cardiac Enzymes: No results for input(s): "CKTOTAL", "CKMB", "CKMBINDEX", "TROPONINI" in the last 168 hours. BNP (last 3 results) No results for input(s): "PROBNP" in the last 8760 hours. HbA1C: No results for input(s): "HGBA1C" in the last 72 hours. CBG: No results for input(s): "GLUCAP" in the last 168 hours. Lipid Profile: No results for input(s): "CHOL", "HDL", "LDLCALC", "TRIG", "CHOLHDL", "LDLDIRECT" in the last 72 hours. Thyroid Function Tests: No results for input(s): "TSH", "T4TOTAL", "FREET4", "T3FREE", "THYROIDAB" in the last 72 hours. Anemia Panel: No results for input(s): "VITAMINB12", "FOLATE", "FERRITIN", "TIBC", "IRON", "RETICCTPCT" in the last 72 hours. Urine analysis:    Component Value Date/Time   COLORURINE YELLOW (A) 05/01/2023 1756   APPEARANCEUR CLEAR (A) 05/01/2023 1756   APPEARANCEUR Clear 11/04/2014 1224   LABSPEC 1.031 (H) 05/01/2023 1756   LABSPEC 1.018 11/04/2014 1224   PHURINE 6.0 05/01/2023 1756   GLUCOSEU >=500 (A) 05/01/2023 1756   GLUCOSEU Negative 11/04/2014 1224   HGBUR NEGATIVE 05/01/2023 1756   BILIRUBINUR NEGATIVE 05/01/2023 1756   BILIRUBINUR Negative 11/04/2014 1224   KETONESUR NEGATIVE 05/01/2023 1756   PROTEINUR NEGATIVE 05/01/2023 1756   NITRITE NEGATIVE 05/01/2023 1756   LEUKOCYTESUR NEGATIVE 05/01/2023 1756   LEUKOCYTESUR Negative 11/04/2014 1224    Radiological Exams on Admission: DG Chest Portable 1 View  Result Date: 05/01/2023 CLINICAL DATA:  Hypoxia EXAM: PORTABLE CHEST 1 VIEW COMPARISON:  Chest x-ray 11/24/2018.  CT of the chest 06/14/2019 FINDINGS: Pleural-parenchymal scarring/opacities are stable from prior. There are new patchy airspace opacities in both lower lungs. Costophrenic angles are clear. Cardiomediastinal silhouette is within normal limits. Left-sided pacemaker is present. No acute fractures are  seen. IMPRESSION: 1. New patchy airspace opacities in both lower lungs concerning for pneumonia. 2. Stable pleural-parenchymal scarring/opacities. Electronically Signed   By: Darliss Cheney M.D.   On: 05/01/2023 18:50     Data Reviewed: Relevant notes from primary care and specialist visits, past discharge summaries as available in EHR, including Care Everywhere. Prior diagnostic testing as pertinent to current admission diagnoses Updated medications and problem lists for reconciliation ED course, including vitals, labs, imaging, treatment and response to treatment Triage notes, nursing and pharmacy notes and ED provider's notes Notable results as noted in HPI   Assessment and Plan: * HCAP (healthcare-associated pneumonia) Acute  hypoxemic respiratory failure COPD Patient has shortness of breath, elevated WBC and chest x-ray showing bilateral airspace disease O2 sats 87% improving to the low to mid 90s with 2L (PFTs 04/2023) with an FEV1/FVC of 64 % and an FEV1 of 55% Continue Rocephin and azithromycin.  Vancomycin for HCAP.  MRSA positive DuoNebs as needed Continue home inhalers Antitussives, incentive spirometry   Acute urinary retention BPH Finasteride and Flomax  Continue Foley  Generalized weakness Secondary to acute illness, physical deconditioning, recent hospitalization with COPD , RMSF Treat contributing conditions PT and TOC consult  Chronic anemia Stable  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 04/22/23 Patient had conjunctivitis and myalgias in July 2017  Zoonotic workup  returned positive for RMSF with titer of 1:128.  Treated empirically with doxycycline for 7 day course.  For repeat titer around 8/1-8/7.   NSVT (nonsustained ventricular tachycardia) (HCC) taking metoprolol 50mg  daily.   History of cardiac pacemaker Myocardial perfusion study was obtained (01/2023) that was normal without evidence of ischemia or scar Echo 04/2023 showed LVEF > 55%, GIIDD,  mild to moderate MVR.  Continue metoprolol, lisinopril, SGLT2i   Type 2 diabetes mellitus, without long-term current use of insulin (HCC) Continue Lantus 40 U Sliding scale coverage  Hypertension Continue metoprolol and lisinopril    DVT prophylaxis: Lovenox  Consults: none  Advance Care Planning: full code  Family Communication: none  Disposition Plan: Back to previous home environment  Severity of Illness: The appropriate patient status for this patient is INPATIENT. Inpatient status is judged to be reasonable and necessary in order to provide the required intensity of service to ensure the patient's safety. The patient's presenting symptoms, physical exam findings, and initial radiographic and laboratory data in the context of their chronic comorbidities is felt to place them at high risk for further clinical deterioration. Furthermore, it is not anticipated that the patient will be medically stable for discharge from the hospital within 2 midnights of admission.   * I certify that at the point of admission it is my clinical judgment that the patient will require inpatient hospital care spanning beyond 2 midnights from the point of admission due to high intensity of service, high risk for further deterioration and high frequency of surveillance required.*  Author: Andris Baumann, MD 05/01/2023 8:10 PM  For on call review [email or web address].

## 2023-05-01 NOTE — Assessment & Plan Note (Addendum)
Myocardial perfusion study was obtained (01/2023) that was normal without evidence of ischemia or scar Echo 04/2023 showed LVEF > 55%, GIIDD, mild to moderate MVR.  Continue metoprolol, lisinopril, SGLT2i

## 2023-05-01 NOTE — Assessment & Plan Note (Signed)
-  Continue metoprolol and lisinopril 

## 2023-05-01 NOTE — Assessment & Plan Note (Addendum)
BPH Finasteride and Flomax  Continue Foley-likely be discharged with Foley and have outpatient urology evaluation.

## 2023-05-01 NOTE — ED Triage Notes (Signed)
Pt brought in via ems from home due to weakness, fall, urinary retention, high blood sugars at home. Ems states pt was recently on anti b for UTI and told he had an enlarged prostate. Pt has hx of diabetes and takes 2 medications for it per ems report. Pt denies pain at this time.

## 2023-05-01 NOTE — ED Provider Notes (Signed)
[facility] Provider Note    Event Date/Time   First MD Initiated Contact with Patient 05/01/23 1758     (approximate)   History   Dysuria and Weakness   HPI  Wesley Wilson is a 82 y.o. male who presents to the emergency department today because of concerns for weakness and difficulty with urination.  Patient states that he was recently seen at [facility].  He does have a history of prostate issues however they did not continue his Flomax.  Today started having lower abdominal distention and pain.  In addition patient has felt generalized weakness.     Physical Exam   Triage Vital Signs: ED Triage Vitals  Encounter Vitals Group     BP 05/01/23 1743 123/67     Systolic BP Percentile --      Diastolic BP Percentile --      Pulse Rate 05/01/23 1743 (!) 103     Resp 05/01/23 1743 (!) 24     Temp 05/01/23 1744 98.4 F (36.9 C)     Temp Source 05/01/23 1744 Oral     SpO2 05/01/23 1743 (!) 87 %     Weight 05/01/23 1741 222 lb (100.7 kg)     Height 05/01/23 1741 6\' 3"  (1.905 m)     Head Circumference --      Peak Flow --      Pain Score 05/01/23 1735 0     Pain Loc --      Pain Education --      Exclude from Growth Chart --     Most recent vital signs: Vitals:   05/01/23 1744 05/01/23 1800  BP:  137/70  Pulse:  94  Resp:  (!) 26  Temp:    SpO2: 95% 92%   General: Awake, alert, oriented. CV:  Good peripheral perfusion. Regular rate and rhythm. Resp:  Normal effort. Lungs clear. Abd:  No distention.  No tenderness.   ED Results / Procedures / Treatments   Labs (all labs ordered are listed, but only abnormal results are displayed) Labs Reviewed  CBC WITH DIFFERENTIAL/PLATELET - Abnormal; Notable for the following components:      Result Value   WBC 13.7 (*)    RBC 4.15 (*)    Hemoglobin 12.2 (*)    HCT 36.5 (*)    Neutro Abs 13.0 (*)    Lymphs Abs 0.2 (*)    Abs Immature Granulocytes 0.08 (*)    All other components within  normal limits  PROTIME-INR - Abnormal; Notable for the following components:   Prothrombin Time 16.0 (*)    INR 1.3 (*)    All other components within normal limits  URINALYSIS, W/ REFLEX TO CULTURE (INFECTION SUSPECTED) - Abnormal; Notable for the following components:   Color, Urine YELLOW (*)    APPearance CLEAR (*)    Specific Gravity, Urine 1.031 (*)    Glucose, UA >=500 (*)    All other components within normal limits  CULTURE, BLOOD (ROUTINE X 2)  CULTURE, BLOOD (ROUTINE X 2)  COMPREHENSIVE METABOLIC PANEL  LACTIC ACID, PLASMA  LACTIC ACID, PLASMA     EKG  I, Phineas Semen, attending physician, personally viewed and interpreted this EKG  EKG Time: 1739 Rate: 98 Rhythm: sinus rhythm Axis: Rightward axis Intervals: qtc 502 QRS: LBBB ST changes: no st elevation Impression: abnormal ekg    RADIOLOGY I independently interpreted and visualized the CXR. My interpretation: Bilateral pneumonia Radiology interpretation:  IMPRESSION:  1. New patchy airspace opacities in both lower lungs concerning for  pneumonia.  2. Stable pleural-parenchymal scarring/opacities.     PROCEDURES:  Critical Care performed: Yes  CRITICAL CARE Performed by: Phineas Semen   Total critical care time: 30 minutes  Critical care time was exclusive of separately billable procedures and treating other patients.  Critical care was necessary to treat or prevent imminent or life-threatening deterioration.  Critical care was time spent personally by me on the following activities: development of treatment plan with patient and/or surrogate as well as nursing, discussions with consultants, evaluation of patient's response to treatment, examination of patient, obtaining history from patient or surrogate, ordering and performing treatments and interventions, ordering and review of laboratory studies, ordering and review of radiographic studies, pulse oximetry and re-evaluation of patient's  condition.   Procedures    MEDICATIONS ORDERED IN ED: Medications - No data to display   IMPRESSION / MDM / ASSESSMENT AND PLAN / ED COURSE  I reviewed the triage vital signs and the nursing notes.                              Differential diagnosis includes, but is not limited to, urinary retention, UTI, anemia, infection  Patient's presentation is most consistent with acute presentation with potential threat to life or bodily function.   The patient is on the cardiac monitor to evaluate for evidence of arrhythmia and/or significant heart rate changes.  Patient presented to the emergency department today with concerns for possible urinary retention as well as weakness.  Notably patient was hypoxic on room air here.  Was placed on 2 L nasal cannula. Blood work with leukocytosis. CXR is concerning for pneumonia. Patient was started on IV antibiotics. In terms of the difficulty with urination patient was found to have retained roughly 500 mls after voiding. Still had discomfort, foley catheter was placed which helped relieve the patient abdominal discomfort. Discussed finding and concern for infection with patient and family. Discussed with Dr. Para March with the hospitalist service who will evaluate for admission.    FINAL CLINICAL IMPRESSION(S) / ED DIAGNOSES   Final diagnoses:  Pneumonia due to infectious organism, unspecified laterality, unspecified part of lung  Urinary retention     Note:  This document was prepared using Dragon voice recognition software and may include unintentional dictation errors.    Phineas Semen, MD 05/01/23 2023

## 2023-05-01 NOTE — Assessment & Plan Note (Signed)
Stable

## 2023-05-01 NOTE — Assessment & Plan Note (Signed)
Secondary to acute illness, physical deconditioning, recent hospitalization with COPD , RMSF Treat contributing conditions PT and TOC consult

## 2023-05-01 NOTE — Assessment & Plan Note (Addendum)
taking metoprolol 50mg  daily.

## 2023-05-01 NOTE — Assessment & Plan Note (Deleted)
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 High res CT chest showing no interstitial lung disease, demonstrated emphysema with bronchial wall thickening suggestive of COPD and small bilateral pleural effusions. Patient with significant 80-pack-year smoking history and history of occupational exposures (farming, welding, textiles, grave digging) and elevated inflammatory markers. Patient treated for likely COPD exacerbation with course of prednisone, doxycycline and breathing treatments while in the hospital. This was a new diagnosis of COPD for the patient.  consistent with COPD. Patient was weaned to room air prior to discharge. On his 6-minute walk test his SpO2 was 88% after his walk. On discharge, patient was prescribed wixhela and incruse ellipta inhalers. Patient was continued on doxycycline for a 7 day course given his RMSF antibody was positive and for COPD exacerbation.

## 2023-05-01 NOTE — Assessment & Plan Note (Addendum)
Acute  hypoxemic respiratory failure COPD Patient has shortness of breath, elevated WBC and chest x-ray showing bilateral airspace disease O2 sats 87% improving to the low to mid 90s with 2L (PFTs 04/2023) with an FEV1/FVC of 64 % and an FEV1 of 55% Continue Rocephin and azithromycin.  Vancomycin for HCAP.  MRSA positive DuoNebs as needed Continue home inhalers Antitussives, incentive spirometry

## 2023-05-02 ENCOUNTER — Encounter: Payer: Self-pay | Admitting: Internal Medicine

## 2023-05-02 ENCOUNTER — Other Ambulatory Visit (HOSPITAL_COMMUNITY): Payer: Self-pay

## 2023-05-02 DIAGNOSIS — Z8619 Personal history of other infectious and parasitic diseases: Secondary | ICD-10-CM

## 2023-05-02 DIAGNOSIS — J189 Pneumonia, unspecified organism: Secondary | ICD-10-CM | POA: Diagnosis not present

## 2023-05-02 DIAGNOSIS — R338 Other retention of urine: Secondary | ICD-10-CM

## 2023-05-02 DIAGNOSIS — J9601 Acute respiratory failure with hypoxia: Secondary | ICD-10-CM

## 2023-05-02 DIAGNOSIS — N401 Enlarged prostate with lower urinary tract symptoms: Secondary | ICD-10-CM | POA: Diagnosis not present

## 2023-05-02 DIAGNOSIS — J449 Chronic obstructive pulmonary disease, unspecified: Secondary | ICD-10-CM

## 2023-05-02 DIAGNOSIS — D649 Anemia, unspecified: Secondary | ICD-10-CM

## 2023-05-02 LAB — PROCALCITONIN: Procalcitonin: 1.12 ng/mL

## 2023-05-02 LAB — CBC
HCT: 35.2 % — ABNORMAL LOW (ref 39.0–52.0)
Hemoglobin: 11.9 g/dL — ABNORMAL LOW (ref 13.0–17.0)
MCH: 29.9 pg (ref 26.0–34.0)
MCHC: 33.8 g/dL (ref 30.0–36.0)
MCV: 88.4 fL (ref 80.0–100.0)
Platelets: 318 10*3/uL (ref 150–400)
RBC: 3.98 MIL/uL — ABNORMAL LOW (ref 4.22–5.81)
RDW: 15.2 % (ref 11.5–15.5)
WBC: 12 10*3/uL — ABNORMAL HIGH (ref 4.0–10.5)
nRBC: 0 % (ref 0.0–0.2)

## 2023-05-02 LAB — BASIC METABOLIC PANEL
Anion gap: 7 (ref 5–15)
BUN: 24 mg/dL — ABNORMAL HIGH (ref 8–23)
CO2: 24 mmol/L (ref 22–32)
Calcium: 7.8 mg/dL — ABNORMAL LOW (ref 8.9–10.3)
Chloride: 101 mmol/L (ref 98–111)
Creatinine, Ser: 1.26 mg/dL — ABNORMAL HIGH (ref 0.61–1.24)
GFR, Estimated: 58 mL/min — ABNORMAL LOW (ref >60)
Glucose, Bld: 139 mg/dL — ABNORMAL HIGH (ref 70–99)
Potassium: 4.3 mmol/L (ref 3.5–5.1)
Sodium: 132 mmol/L — ABNORMAL LOW (ref 135–145)

## 2023-05-02 LAB — GLUCOSE, CAPILLARY
Glucose-Capillary: 128 mg/dL — ABNORMAL HIGH (ref 70–99)
Glucose-Capillary: 157 mg/dL — ABNORMAL HIGH (ref 70–99)
Glucose-Capillary: 221 mg/dL — ABNORMAL HIGH (ref 70–99)
Glucose-Capillary: 247 mg/dL — ABNORMAL HIGH (ref 70–99)

## 2023-05-02 LAB — CULTURE, BLOOD (ROUTINE X 2)
Culture: NO GROWTH
Culture: NO GROWTH
Special Requests: ADEQUATE

## 2023-05-02 LAB — BRAIN NATRIURETIC PEPTIDE: B Natriuretic Peptide: 522.2 pg/mL — ABNORMAL HIGH (ref 0.0–100.0)

## 2023-05-02 MED ORDER — AZITHROMYCIN 500 MG PO TABS
500.0000 mg | ORAL_TABLET | Freq: Every day | ORAL | Status: DC
  Administered 2023-05-02 – 2023-05-03 (×2): 500 mg via ORAL
  Filled 2023-05-02 (×2): qty 1

## 2023-05-02 MED ORDER — CHLORDIAZEPOXIDE HCL 5 MG PO CAPS
10.0000 mg | ORAL_CAPSULE | Freq: Three times a day (TID) | ORAL | Status: DC | PRN
  Administered 2023-05-02 – 2023-05-04 (×3): 10 mg via ORAL
  Filled 2023-05-02 (×3): qty 2

## 2023-05-02 MED ORDER — ARFORMOTEROL TARTRATE 15 MCG/2ML IN NEBU
15.0000 ug | INHALATION_SOLUTION | Freq: Two times a day (BID) | RESPIRATORY_TRACT | Status: DC
  Administered 2023-05-02 – 2023-05-04 (×4): 15 ug via RESPIRATORY_TRACT
  Filled 2023-05-02 (×5): qty 2

## 2023-05-02 MED ORDER — TADALAFIL 5 MG PO TABS
5.0000 mg | ORAL_TABLET | Freq: Every day | ORAL | Status: DC
  Administered 2023-05-04: 5 mg via ORAL
  Filled 2023-05-02 (×2): qty 1

## 2023-05-02 MED ORDER — METOPROLOL SUCCINATE ER 50 MG PO TB24
50.0000 mg | ORAL_TABLET | Freq: Every day | ORAL | Status: DC
  Administered 2023-05-04: 50 mg via ORAL
  Filled 2023-05-02: qty 1

## 2023-05-02 MED ORDER — CHLORHEXIDINE GLUCONATE CLOTH 2 % EX PADS
6.0000 | MEDICATED_PAD | Freq: Every day | CUTANEOUS | Status: DC
  Administered 2023-05-02 – 2023-05-04 (×3): 6 via TOPICAL

## 2023-05-02 MED ORDER — REVEFENACIN 175 MCG/3ML IN SOLN
175.0000 ug | Freq: Every day | RESPIRATORY_TRACT | Status: DC
  Administered 2023-05-03 – 2023-05-04 (×2): 175 ug via RESPIRATORY_TRACT
  Filled 2023-05-02 (×2): qty 3

## 2023-05-02 MED ORDER — TAMSULOSIN HCL 0.4 MG PO CAPS
0.8000 mg | ORAL_CAPSULE | Freq: Every day | ORAL | Status: DC
  Administered 2023-05-02 – 2023-05-03 (×2): 0.8 mg via ORAL
  Filled 2023-05-02 (×2): qty 2

## 2023-05-02 MED ORDER — SODIUM CHLORIDE 0.9 % IV SOLN
2.0000 g | INTRAVENOUS | Status: DC
  Administered 2023-05-02 – 2023-05-03 (×2): 2 g via INTRAVENOUS
  Filled 2023-05-02 (×3): qty 20

## 2023-05-02 MED ORDER — BUDESONIDE 0.5 MG/2ML IN SUSP
0.5000 mg | Freq: Two times a day (BID) | RESPIRATORY_TRACT | Status: DC
  Administered 2023-05-02 – 2023-05-04 (×4): 0.5 mg via RESPIRATORY_TRACT
  Filled 2023-05-02 (×4): qty 2

## 2023-05-02 MED ORDER — SODIUM CHLORIDE 0.9 % IV SOLN
500.0000 mg | INTRAVENOUS | Status: DC
  Filled 2023-05-02: qty 5

## 2023-05-02 NOTE — Progress Notes (Signed)
Progress Note   [name] ZOX:[id number] DOB: [birth year] DOA: 05/01/2023     1 DOS: the patient was seen and examined on 05/02/2023   Brief hospital course: Taken from H&P.  ANTHONEE GELIN is a 82 y.o. male with medical history significant for intermittent AV block s/p PPM, NSVT, HTN, HLD, BPH,, T2DM (on trulicity), prior alcohol use, and prior smoker (80-pack year) , hospitalized at [facility] from 7/12 to 7/19 for with respiratory failure secondary to COPD exacerbation, requiring 2 to 3 L, weaned off O2 prior to discharge, who presents to the ED with weakness and a fall, high blood sugars at home as well as urinary retention.   During his recent stay at [facility] he had myalgias and conjunctivitis and was RMSF positive(titer of 1: 128) and discharged with doxycycline.  During workup in the ED, a postvoid residual was done with 525 mL. While in the ED he dropped his O2 sat to the mid 80s and was placed on O2 at 2L ED course and data review: On arrival afebrile, BP 123/67, tachycardic to 103, tachypneic to 24 with O2 sat 87% on room air.  Labs with WBC 13,000, hemoglobin 12, up from 11.2 at discharge on 7/16, lactic acid 1.1.  Troponin and BNP not done.  Urinalysis unremarkable.  Chest x-ray consistent with pneumonia as follows: IMPRESSION: 1. New patchy airspace opacities in both lower lungs concerning for pneumonia. 2. Stable pleural-parenchymal scarring/opacities.   Patient started on Rocephin and azithromycin He had a Foley placed in the ED.  7/22: Vital stable.  BNP elevated at 522, procalcitonin at 1.12.  Will encourage p.o. hydration and avoid IV fluid.  Most likely will discharge his Foley catheter in place and follow-up with urology closely for further recommendations and voiding trial.  Assessment and Plan: * HCAP (healthcare-associated pneumonia) Acute  hypoxemic respiratory failure COPD Patient has shortness of breath, elevated WBC and chest x-ray showing bilateral airspace  disease O2 sats 87% improving to the low to mid 90s with 2L (PFTs 04/2023) with an FEV1/FVC of 64 % and an FEV1 of 55% Continue Rocephin and azithromycin.  Vancomycin for HCAP.  MRSA positive DuoNebs as needed Continue home inhalers Antitussives, incentive spirometry   Acute urinary retention BPH Finasteride and Flomax  Continue Foley-likely be discharged with Foley and have outpatient urology evaluation.  Generalized weakness Secondary to acute illness, physical deconditioning, recent hospitalization with COPD , RMSF Treat contributing conditions PT and TOC consult  Hypertension Continue metoprolol and lisinopril  Type 2 diabetes mellitus, without long-term current use of insulin (HCC) Continue Lantus 40 U Sliding scale coverage  History of cardiac pacemaker Myocardial perfusion study was obtained (01/2023) that was normal without evidence of ischemia or scar Echo 04/2023 showed LVEF > 55%, GIIDD, mild to moderate MVR.  Continue metoprolol, lisinopril, SGLT2i   NSVT (nonsustained ventricular tachycardia) (HCC) taking metoprolol 50mg  daily.   History of [facility] spotted fever 04/22/23 Patient had conjunctivitis and myalgias in July 2017  Zoonotic workup  returned positive for RMSF with titer of 1:128.  Treated empirically with doxycycline for 7 day course.  For repeat titer around 8/1-8/7.   Chronic anemia Stable  Chronic obstructive pulmonary disease (COPD) (HCC) No wheezing. -Continue home bronchodilators   Subjective: Patient continues to have some cough and congestion.  Stating that he is feeling much better after placing Foley catheter as this urinary retention was causing a lot of trouble.  Physical Exam: Vitals:   05/02/23 0030 05/02/23 0100 05/02/23  0730 05/02/23 1125  BP: 117/69 129/79 116/68 108/66  Pulse: 85 83 84 82  Resp: (!) 25 16 16 18   Temp:  99 F (37.2 C)  97.8 F (36.6 C)  TempSrc:  Oral  Oral  SpO2: 91% 92% 91% 92%  Weight:  96.5 kg     Height:       General.  Frail elderly man, in no acute distress. Pulmonary.  Lungs clear bilaterally, normal respiratory effort. CV.  Regular rate and rhythm, no JVD, rub or murmur. Abdomen.  Soft, nontender, nondistended, BS positive. CNS.  Alert and oriented .  No focal neurologic deficit. Extremities.  No edema, no cyanosis, pulses intact and symmetrical. Psychiatry.  Judgment and insight appears normal.   Data Reviewed: Prior data reviewed  Family Communication:   Disposition: Status is: Inpatient Remains inpatient appropriate because: Severity of illness  Planned Discharge Destination: To be determined  DVT prophylaxis.  Lovenox Time spent: 45 minutes  This record has been created using Conservation officer, historic buildings. Errors have been sought and corrected,but may not always be located. Such creation errors do not reflect on the standard of care.   Author: Arnetha Courser, MD 05/02/2023 3:42 PM  For on call review [email or web address].

## 2023-05-02 NOTE — TOC Benefit Eligibility Note (Signed)
Pharmacy Patient Advocate Encounter  Insurance verification completed.    The patient is insured through [employer] Part D  Ran test claim for Farxiga 10 mg and the current 30 day co-pay is $143.16 due to being in Coverage Gap (donut hole).  Ran test claim for Jardiance 10 mg and the current 30 day co-pay is $150.25 due to being in Coverage Gap (donut hole).   This test claim was processed through [facility]- copay amounts may vary at other pharmacies due to pharmacy/plan contracts, or as the patient moves through the different stages of their insurance plan.    Wesley Wilson, CPHT Pharmacy Patient Advocate Specialist [facility] Patient Advocate Team Direct Number: [phone]  Fax: [phone]

## 2023-05-02 NOTE — Hospital Course (Addendum)
Taken from H&P.  Wesley Wilson is a 82 y.o. male with medical history significant for intermittent AV block s/p PPM, NSVT, HTN, HLD, BPH,, T2DM (on trulicity), prior alcohol use, and prior smoker (80-pack year) , hospitalized at [facility] from 7/12 to 7/19 for with respiratory failure secondary to COPD exacerbation, requiring 2 to 3 L, weaned off O2 prior to discharge, who presents to the ED with weakness and a fall, high blood sugars at home as well as urinary retention.   During his recent stay at [facility] he had myalgias and conjunctivitis and was RMSF positive(titer of 1: 128) and discharged with doxycycline.  During workup in the ED, a postvoid residual was done with 525 mL. While in the ED he dropped his O2 sat to the mid 80s and was placed on O2 at 2L ED course and data review: On arrival afebrile, BP 123/67, tachycardic to 103, tachypneic to 24 with O2 sat 87% on room air.  Labs with WBC 13,000, hemoglobin 12, up from 11.2 at discharge on 7/16, lactic acid 1.1.  Troponin and BNP not done.  Urinalysis unremarkable.  Chest x-ray consistent with pneumonia as follows: IMPRESSION: 1. New patchy airspace opacities in both lower lungs concerning for pneumonia. 2. Stable pleural-parenchymal scarring/opacities.   Patient started on Rocephin and azithromycin He had a Foley placed in the ED.  7/22: Vital stable.  BNP elevated at 522, procalcitonin at 1.12.  Will encourage p.o. hydration and avoid IV fluid.  Most likely will discharge his Foley catheter in place and follow-up with urology closely for further recommendations and voiding trial.  7/23: Continue to require 2 L of oxygen, softer blood pressure holding morning dose of metoprolol and lisinopril.  Repeat chest x-ray stable.  PT recommending SNF

## 2023-05-02 NOTE — Progress Notes (Signed)
PHARMACIST - PHYSICIAN COMMUNICATION  CONCERNING: Antibiotic IV to Oral Route Change Policy  RECOMMENDATION: This patient is receiving azithromycin by the intravenous route.  Based on criteria approved by the Pharmacy and Therapeutics Committee, the antibiotic(s) is/are being converted to the equivalent oral dose form(s).  DESCRIPTION: These criteria include: Patient being treated for a respiratory tract infection, urinary tract infection, cellulitis or clostridium difficile associated diarrhea if on metronidazole The patient is not neutropenic and does not exhibit a GI malabsorption state The patient is eating (either orally or via tube) and/or has been taking other orally administered medications for a least 24 hours The patient is improving clinically and has a Tmax < 100.5  If you have questions about this conversion, please contact the Pharmacy Department   Tressie Ellis 05/02/23

## 2023-05-02 NOTE — Plan of Care (Signed)

## 2023-05-02 NOTE — Assessment & Plan Note (Signed)
No wheezing. -Continue home bronchodilators

## 2023-05-03 ENCOUNTER — Inpatient Hospital Stay: Payer: Medicare HMO

## 2023-05-03 DIAGNOSIS — R338 Other retention of urine: Secondary | ICD-10-CM | POA: Diagnosis not present

## 2023-05-03 DIAGNOSIS — J189 Pneumonia, unspecified organism: Secondary | ICD-10-CM | POA: Diagnosis not present

## 2023-05-03 DIAGNOSIS — N401 Enlarged prostate with lower urinary tract symptoms: Secondary | ICD-10-CM | POA: Diagnosis not present

## 2023-05-03 DIAGNOSIS — J9601 Acute respiratory failure with hypoxia: Secondary | ICD-10-CM | POA: Diagnosis not present

## 2023-05-03 LAB — HEMOGLOBIN A1C
Hgb A1c MFr Bld: 8.9 % — ABNORMAL HIGH (ref 4.8–5.6)
Mean Plasma Glucose: 209 mg/dL

## 2023-05-03 LAB — GLUCOSE, CAPILLARY
Glucose-Capillary: 199 mg/dL — ABNORMAL HIGH (ref 70–99)
Glucose-Capillary: 290 mg/dL — ABNORMAL HIGH (ref 70–99)
Glucose-Capillary: 335 mg/dL — ABNORMAL HIGH (ref 70–99)

## 2023-05-03 LAB — CULTURE, BLOOD (ROUTINE X 2)

## 2023-05-03 MED ORDER — LIVING WELL WITH DIABETES BOOK
Freq: Once | Status: AC
  Filled 2023-05-03: qty 1

## 2023-05-03 MED ORDER — TRAZODONE HCL 50 MG PO TABS
50.0000 mg | ORAL_TABLET | Freq: Every day | ORAL | Status: AC
  Administered 2023-05-03: 50 mg via ORAL
  Filled 2023-05-03: qty 1

## 2023-05-03 MED ORDER — SODIUM CHLORIDE 0.9 % IV BOLUS
500.0000 mL | Freq: Once | INTRAVENOUS | Status: AC
  Administered 2023-05-03: 500 mL via INTRAVENOUS

## 2023-05-03 NOTE — Progress Notes (Signed)
Patient states he has been managing his diabetes with a mixture of Trulicity, Jardiance and "nothing" depending upon what he has access too. MD made aware and diabetes consult placed.

## 2023-05-03 NOTE — TOC Initial Note (Signed)
Transition of Care [facility]) - Initial/Assessment Note    Patient Details  Name: Wesley Wilson MRN: [id number] Date of Birth: 21-May-1941  Transition of Care [facility]) CM/SW Contact:    Garret Reddish, RN Phone Number: 05/03/2023, 3:07 PM  Clinical Narrative:   Chart reviewed. Patient was admitted with HCAP (healthcare-associated pneumonia) Acute  hypoxemic respiratory failure COPD.  Patient is currently on IV Rocephin and azithromycin.  Patient is also on IV Vancomycin for PNA.    I have meet with patient at bedside.  Wesley Wilson reports that prior to admission he lived at home with his wife.  Wesley Wilson reports that his wife is bedridden and he has assistance to assist with her care daily.  He also reports that his sister is an Charity fundraiser and is staying with his wife while he is in the hospital.  He reports that his niece and his sons also assist with the care of his wife.  Wesley Wilson care for his wife at night.    Wesley Wilson reports that he recently just go out to of the hospital last Friday for [facility].  He reports that Home Health PT was arranged on discharge.  He states the he did not now the name of the agency.  Wesley Wilson currently has no home 02.  Patient is able to get around with a walker.  Patient reports that he still drives.  Wesley Wilson reports that his sons and niece assist with meals for him and his wife at home.    He plans to return home when stable for discharge.    TOC will continue to follow for discharge planning.    Expected Discharge Plan: Home w Home Health Services Barriers to Discharge: No Barriers Identified   Patient Goals and CMS Choice            Expected Discharge Plan and Services   Discharge Planning Services: CM Consult                                          Prior Living Arrangements/Services   Lives with:: Spouse Patient language and need for interpreter reviewed:: Yes Do you feel safe going back to the place where you live?: Yes      Need for  Family Participation in Patient Care: Yes (Comment) (Supportive sons) Care giver support system in place?: Yes (comment) (Supportive sons and extended family) Current home services:  (Patient has 2 walkers at home.  One walker has wheels and brakes.  Patient has a BSC and and shower chair at home.)    Activities of Daily Living Home Assistive Devices/Equipment: Environmental consultant (specify type), Eyeglasses, Dentures (specify type) ADL Screening (condition at time of admission) Patient's cognitive ability adequate to safely complete daily activities?: Yes Is the patient deaf or have difficulty hearing?: No Does the patient have difficulty seeing, even when wearing glasses/contacts?: No Does the patient have difficulty concentrating, remembering, or making decisions?: No Patient able to express need for assistance with ADLs?: Yes Does the patient have difficulty dressing or bathing?: No Independently performs ADLs?: No Communication: Independent Dressing (OT): Needs assistance Is this a change from baseline?: Change from baseline, expected to last <3days Grooming: Needs assistance Is this a change from baseline?: Change from baseline, expected to last <3 days Feeding: Independent Bathing: Needs assistance Is this a change from baseline?: Change from baseline, expected to last <3  days Toileting: Needs assistance Is this a change from baseline?: Change from baseline, expected to last <3 days In/Out Bed: Needs assistance Is this a change from baseline?: Change from baseline, expected to last <3 days Walks in Home: Needs assistance Is this a change from baseline?: Change from baseline, expected to last <3 days Does the patient have difficulty walking or climbing stairs?: Yes Weakness of Legs: Both Weakness of Arms/Hands: None  Permission Sought/Granted                  Emotional Assessment Appearance:: Appears older than stated age Attitude/Demeanor/Rapport: Engaged Affect (typically  observed): Pleasant Orientation: : Oriented to Self, Oriented to Place, Oriented to  Time, Oriented to Situation      Admission diagnosis:  Urinary retention [R33.9] Acute urinary retention [R33.8] Pneumonia due to infectious organism, unspecified laterality, unspecified part of lung [J18.9] Patient Active Problem List   Diagnosis Date Noted   History of cardiac pacemaker 05/01/2023   NSVT (nonsustained ventricular tachycardia) (HCC) 05/01/2023   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, [place] - [address] [place] [address] Phone: [phone] Fax: [phone]     Social Determinants of Health (SDOH) Social History: SDOH Screenings   Food Insecurity: No Food Insecurity (05/02/2023)  Housing: Low Risk  (05/02/2023)  Transportation Needs: No Transportation Needs (05/02/2023)  Utilities: Not At Risk (05/02/2023)  Financial Resource Strain: Low Risk  (04/24/2023)   Received from [facility]  Tobacco Use: Medium Risk (05/02/2023)   SDOH Interventions:     Readmission Risk Interventions     No data to display

## 2023-05-03 NOTE — Progress Notes (Signed)
Physical Therapy Treatment Patient Details Name: Wesley Wilson MRN: [id number] DOB: 20-Jul-1941 Today's Date: 05/03/2023   History of Present Illness Wesley Wilson is an 82yoM who comes to [facility] on [facility]23 c weakness and dysuria. PMH: AV block s/p PPM, NSVT, HTN, BPH, DM2. Pt was at [facility] 7/12-7/19 for COPD exacerbation. During his recent stay at [facility] he had myalgias and conjunctivitis and was RMSF positive, and discharged with doxycycline. Workup here this admission revealing of PNA as well as elevated BNP 500s.    PT Comments  Author returning for 2nd session as pt expressed interest in working on AMB more. Pt on room air x 1 hour on arrival, sats at goal 90%. Pt moved to 2L for AMB given desat on RA in AM eval. Pt able to AMB 179ft on 2L/min c RW, slow and steady, sats at 95-96%. Pt continues to struggle most with rising to standing from standard surface heights, this remains the biggest barrier to direct DC to home. Pt able to rise from 23" height bed, but very unlikely he will find surfaces this high at home. His wife is not able to physically assist him to a standing position. Will continue to work on this. RN in room at end of session.     Assistance Recommended at Discharge Intermittent Supervision/Assistance  If plan is discharge home, recommend the following:  Can travel by private vehicle    A little help with walking and/or transfers;Assistance with cooking/housework;Assist for transportation;Help with stairs or ramp for entrance   No  Equipment Recommendations  None recommended by PT    Recommendations for Other Services       Precautions / Restrictions Precautions Precautions: Fall Restrictions Weight Bearing Restrictions: No     Mobility  Bed Mobility Overal bed mobility: Needs Assistance Bed Mobility: Supine to Sit, Sit to Supine     Supine to sit: Supervision, HOB elevated          Transfers Overall transfer level: Needs assistance   Transfers:  Sit to/from Stand Sit to Stand: From elevated surface, Supervision           General transfer comment: says hes beginning to feel a bit more robust    Ambulation/Gait Ambulation/Gait assistance: Min guard Gait Distance (Feet): 200 Feet Assistive device: Rolling walker (2 wheels) Gait Pattern/deviations: Step-through pattern, WFL(Within Functional Limits) Gait velocity: 0.25m/s; on room air on entry at 90%; on 2L for AMB at 95-96%     General Gait Details: author drives O2 tank on 2L;   Stairs             Wheelchair Mobility     Tilt Bed    Modified Rankin (Stroke Patients Only)       Balance                                            Cognition Arousal/Alertness: Awake/alert Behavior During Therapy: WFL for tasks assessed/performed Overall Cognitive Status: Within Functional Limits for tasks assessed                                          Exercises      General Comments        Pertinent Vitals/Pain Pain Assessment Pain Assessment: Faces Faces Pain Scale: Hurts  a little bit Pain Location: Lt hip is sore from being immobile Pain Descriptors / Indicators: Aching Pain Intervention(s): Limited activity within patient's tolerance, Monitored during session, Premedicated before session    Home Living Family/patient expects to be discharged to:: Private residence Living Arrangements: Spouse/significant other (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    Prior Function            PT Goals (current goals can now be found in the care plan section) Acute Rehab PT Goals Patient Stated Goal: regain strength for return home PT Goal Formulation: With patient Time For Goal Achievement: 05/17/23 Potential to Achieve Goals: Fair Progress towards PT  goals: Progressing toward goals    Frequency    Min 1X/week      PT Plan Current plan remains appropriate    Co-evaluation              AM-PAC PT "6 Clicks" Mobility   Outcome Measure  Help needed turning from your back to your side while in a flat bed without using bedrails?: A Little Help needed moving from lying on your back to sitting on the side of a flat bed without using bedrails?: A Little Help needed moving to and from a bed to a chair (including a wheelchair)?: A Lot Help needed standing up from a chair using your arms (e.g., wheelchair or bedside chair)?: A Lot Help needed to walk in hospital room?: A Little Help needed climbing 3-5 steps with a railing? : A Lot 6 Click Score: 15    End of Session Equipment Utilized During Treatment: Oxygen Activity Tolerance: Patient tolerated treatment well;No increased pain Patient left: in bed;with call bell/phone within reach;with nursing/sitter in room Nurse Communication: Mobility status PT Visit Diagnosis: Unsteadiness on feet (R26.81);Other abnormalities of gait and mobility (R26.89);Repeated falls (R29.6);Muscle weakness (generalized) (M62.81);Dizziness and giddiness (R42);Difficulty in walking, not elsewhere classified (R26.2)     Time: 1535-1550 PT Time Calculation (min) (ACUTE ONLY): 15 min  Charges:    $Therapeutic Activity: 8-22 mins PT General Charges $$ ACUTE PT VISIT: 1 Visit                    4:02 PM, 05/03/23 Rosamaria Lints, PT, DPT Physical Therapist - [facility]  [phone] (ASCOM)    Jermario Kalmar C 05/03/2023, 3:59 PM

## 2023-05-03 NOTE — Assessment & Plan Note (Signed)
Secondary to acute illness, physical deconditioning, recent hospitalization with COPD , RMSF Treat contributing conditions PT and TOC consult-PT recommending SNF

## 2023-05-03 NOTE — Evaluation (Signed)
Physical Therapy Evaluation Patient Details Name: Wesley Wilson MRN: [id number] DOB: [birth year] Today's Date: 05/03/2023  History of Present Illness  Wesley Wilson is an 82yoM who comes to [facility] on 05/01/23 c weakness and dysuria. PMH: AV block s/p PPM, NSVT, HTN, BPH, DM2. Pt was at [facility] 7/12-7/19 for COPD exacerbation. During his recent stay at [facility] he had myalgias and conjunctivitis and was RMSF positive, and discharged with doxycycline. Workup here this admission revealing of PNA as well as elevated BNP 500s.  Clinical Impression  Pt in bed, O2 donned, he is agreeable to evaluation, albeit continues to feel weak, tired, and tight in chest. Pt requires assistance to rise to standing due to weakness and does not feel attempt without device wise, despit eno use of RW at baseline. With RW pt able to demonstrate safe household distance AMB, but desats <90% on room air, also different from baseline. Pt agreeable to sit EOB while RN prepares morning meds. 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 Will continue to follow.       Assistance Recommended at Discharge Intermittent Supervision/Assistance  If plan is discharge home, recommend the following:  Can travel by private vehicle  A little help with walking and/or transfers;Assistance with cooking/housework;Assist for transportation;Help with stairs or ramp for entrance   No    Equipment Recommendations None recommended by PT  Recommendations for Other Services       Functional Status Assessment Patient has had a recent decline in their functional status and demonstrates the ability to make significant improvements in function in a reasonable and predictable amount of time.     Precautions / Restrictions Precautions Precautions: Fall Restrictions Weight Bearing Restrictions: No      Mobility  Bed  Mobility   Bed Mobility: Supine to Sit, Sit to Supine     Supine to sit: Supervision, HOB elevated          Transfers Overall transfer level: Needs assistance   Transfers: Sit to/from Stand Sit to Stand: Min guard, From elevated surface           General transfer comment: heavy effort from elevated surface, pt endorses significant acute weakness    Ambulation/Gait Ambulation/Gait assistance: Min guard Gait Distance (Feet): 60 Feet Assistive device: Rolling walker (2 wheels) Gait Pattern/deviations: Step-through pattern       General Gait Details: AMB EOB to counter and back twice, safe use of RW, no LOB, fatigue toward end of walk, but not fully taxed. Pt AMB on room air with terminal sats at 87-88%. Returned to [place] at 2L/min end of AMB.  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    Prior Function Prior Level of Function : Driving             Mobility Comments: caregiver for his wife, was 'bush-hogging' just a few weeks ago. ADLs Comments: modI to independent     Hand Dominance        Extremity/Trunk Assessment                Communication      Cognition Arousal/Alertness: Awake/alert Behavior During Therapy: WFL for tasks assessed/performed Overall Cognitive Status: Within Functional Limits for tasks assessed                                          General Comments       Exercises     Assessment/Plan    PT Assessment Patient needs continued PT services  PT Problem List Decreased strength;Decreased activity tolerance;Decreased balance;Decreased mobility;Decreased knowledge of use of DME;Decreased safety awareness;Decreased knowledge of precautions       PT Treatment Interventions Gait training;Stair training;Functional mobility training;Therapeutic activities;Therapeutic exercise;Balance training;Neuromuscular re-education;DME instruction;Patient/family education    PT Goals (Current goals can be found in the Care Plan section)  Acute Rehab PT Goals Patient Stated Goal: regain strength for return home PT Goal Formulation: With patient Time For Goal Achievement: 05/17/23 Potential to Achieve Goals: Fair    Frequency Min 1X/week     Co-evaluation               AM-PAC PT "6 Clicks" Mobility  Outcome Measure Help needed turning from your back to your side while in a flat bed without using bedrails?: A Little Help needed moving from lying on your back to sitting on the side of a flat bed without using bedrails?: A Little Help needed moving to and from a bed to a chair (including a wheelchair)?: A Lot Help needed standing up from a chair using your arms (e.g., wheelchair or bedside chair)?: A Lot Help needed to walk in hospital room?: A Little Help needed climbing 3-5 steps with a railing? : A Lot 6 Click Score: 15    End of Session Equipment Utilized During Treatment: Oxygen Activity Tolerance: Patient tolerated treatment well;No increased pain Patient left: in bed;with family/visitor present;with call bell/phone within reach;with nursing/sitter in room Nurse Communication: Mobility status PT Visit Diagnosis: Unsteadiness on feet (R26.81);Other abnormalities of gait and mobility (R26.89);Repeated falls (R29.6);Muscle weakness (generalized) (M62.81);Dizziness and giddiness (R42);Difficulty in walking, not elsewhere classified (R26.2)     Time: 4098-1191 PT Time Calculation (min) (ACUTE ONLY): 14 min   Charges:   PT Evaluation $PT Eval Moderate Complexity: 1 Mod   PT General Charges $$ ACUTE PT VISIT: 1 Visit       2:22 PM, 05/03/23 Rosamaria Lints, PT, DPT Physical Therapist - [facility]  [phone] (ASCOM)     Tuleen Mandelbaum C 05/03/2023, 2:19 PM

## 2023-05-03 NOTE — Inpatient Diabetes Management (Addendum)
Inpatient Diabetes Program Recommendations  AACE/ADA: New Consensus Statement on Inpatient Glycemic Control (2015)  Target Ranges:  Prepandial:   less than 140 mg/dL      Peak postprandial:   less than 180 mg/dL (1-2 hours)      Critically ill patients:  140 - 180 mg/dL   Lab Results  Component Value Date   GLUCAP 290 (H) 05/03/2023   HGBA1C 8.9 (H) 05/02/2023    Review of Glycemic Control  Latest Reference Range & Units 05/02/23 07:31 05/02/23 13:09 05/02/23 16:36 05/02/23 20:17 05/03/23 08:02 05/03/23 11:38  Glucose-Capillary 70 - 99 mg/dL 829 (H) 562 (H) 130 (H) 247 (H) 199 (H) 290 (H)  (H): Data is abnormally high  Diabetes history: DM2 Outpatient Diabetes medications:  Jardiance 25 mg every day Trulicity 1.5 mg weekly Current orders for Inpatient glycemic control:  Novolog 0-9 units TID and 0-5 units QHS  Inpatient Diabetes Program Recommendations:    Might consider:  Novolog 2 units TID with meals if he consumes at least 50%.  Spoke with patient on the phone.  He takes Trulicity 1.5 mg weekly on Saturdays; his PCP started him back on Jardiance 25 mg last week.  His sister checks his glucose at home.  He states its been up a bit lately.  He admits to drinking Sprite.  Encouraged him to switch to diet or zero sugar beverages.  He eats a lot vegetables and baked fish.  Educated on The Plate Method, CHO's, portion control, CBGs at home fasting and mid afternoon, F/U with PCP every 3 months, bring meter to PCP office, long and short term complications of uncontrolled BG, and importance of exercise.  His glucose trends should improve at home since [place] was recently added back.  Ordered the LWWD booklet.   Will continue to follow while inpatient.  Thank you, Dulce Sellar, MSN, CDCES Diabetes Coordinator Inpatient Diabetes Program [phone] (team pager from 8a-5p)

## 2023-05-03 NOTE — Progress Notes (Signed)
SATURATION QUALIFICATIONS: (This note is used to comply with regulatory documentation for home oxygen)  Patient Saturations on Room Air at Rest = 90 %  Patient Saturations on Room Air while Ambulating = 87 %  Patient Saturations on 2 Liters of oxygen while Ambulating = 91%  Please briefly explain why patient needs home oxygen: Needs O2 with ambulation

## 2023-05-03 NOTE — Assessment & Plan Note (Signed)
Blood pressure on softer side this morning, holding morning dose of metoprolol and lisinopril Continue metoprolol and lisinopril-if blood pressure remained stable or elevated

## 2023-05-03 NOTE — Assessment & Plan Note (Signed)
With no baseline oxygen use.  Likely secondary to pneumonia. Repeat chest x-ray stable. -Continue supplemental oxygen-wean as tolerated

## 2023-05-03 NOTE — Progress Notes (Signed)
Progress Note   [name] XBM:[id number] DOB: 1941/07/23 DOA: 05/01/2023     2 DOS: the patient was seen and examined on 05/03/2023   Brief hospital course: Taken from H&P.  RAZI HICKLE is a 82 y.o. male with medical history significant for intermittent AV block s/p PPM, NSVT, HTN, HLD, BPH,, T2DM (on trulicity), prior alcohol use, and prior smoker (80-pack year) , hospitalized at [facility] from 7/12 to 7/19 for with respiratory failure secondary to COPD exacerbation, requiring 2 to 3 L, weaned off O2 prior to discharge, who presents to the ED with weakness and a fall, high blood sugars at home as well as urinary retention.   During his recent stay at [facility] he had myalgias and conjunctivitis and was RMSF positive(titer of 1: 128) and discharged with doxycycline.  During workup in the ED, a postvoid residual was done with 525 mL. While in the ED he dropped his O2 sat to the mid 80s and was placed on O2 at 2L ED course and data review: On arrival afebrile, BP 123/67, tachycardic to 103, tachypneic to 24 with O2 sat 87% on room air.  Labs with WBC 13,000, hemoglobin 12, up from 11.2 at discharge on 7/16, lactic acid 1.1.  Troponin and BNP not done.  Urinalysis unremarkable.  Chest x-ray consistent with pneumonia as follows: IMPRESSION: 1. New patchy airspace opacities in both lower lungs concerning for pneumonia. 2. Stable pleural-parenchymal scarring/opacities.   Patient started on Rocephin and azithromycin He had a Foley placed in the ED.  7/22: Vital stable.  BNP elevated at 522, procalcitonin at 1.12.  Will encourage p.o. hydration and avoid IV fluid.  Most likely will discharge his Foley catheter in place and follow-up with urology closely for further recommendations and voiding trial.  7/23: Continue to require 2 L of oxygen, softer blood pressure holding morning dose of metoprolol and lisinopril.  Repeat chest x-ray stable.  PT recommending SNF  Assessment and Plan: * HCAP  (healthcare-associated pneumonia) Acute  hypoxemic respiratory failure COPD Patient has shortness of breath, elevated WBC and chest x-ray showing bilateral airspace disease O2 sats 87% improving to the low to mid 90s with 2L (PFTs 04/2023) with an FEV1/FVC of 64 % and an FEV1 of 55% Continue Rocephin and azithromycin.  Vancomycin for HCAP.  MRSA positive DuoNebs as needed Continue home inhalers Antitussives, incentive spirometry   Acute hypoxemic respiratory failure (HCC) With no baseline oxygen use.  Likely secondary to pneumonia. Repeat chest x-ray stable. -Continue supplemental oxygen-wean as tolerated  Acute urinary retention BPH Finasteride and Flomax  Continue Foley-likely be discharged with Foley and have outpatient urology evaluation.  Generalized weakness Secondary to acute illness, physical deconditioning, recent hospitalization with COPD , RMSF Treat contributing conditions PT and TOC consult-PT recommending SNF  Hypertension Blood pressure on softer side this morning, holding morning dose of metoprolol and lisinopril Continue metoprolol and lisinopril-if blood pressure remained stable or elevated  Type 2 diabetes mellitus, without long-term current use of insulin (HCC) Continue Lantus 40 U Sliding scale coverage  History of cardiac pacemaker Myocardial perfusion study was obtained (01/2023) that was normal without evidence of ischemia or scar Echo 04/2023 showed LVEF > 55%, GIIDD, mild to moderate MVR.  Continue metoprolol, lisinopril, SGLT2i   NSVT (nonsustained ventricular tachycardia) (HCC) taking metoprolol 50mg  daily.   History of [facility] spotted fever 04/22/23 Patient had conjunctivitis and myalgias in July 2017  Zoonotic workup  returned positive for RMSF with titer of 1:128.  Treated empirically with doxycycline for 7 day course.  For repeat titer around 8/1-8/7.   Chronic anemia Stable  Chronic obstructive pulmonary disease (COPD) (HCC) No  wheezing. -Continue home bronchodilators   Subjective: Patient was lying down comfortably when seen today.  Still having some shortness of breath.  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  Physical Exam: Vitals:   05/03/23 0807 05/03/23 0848 05/03/23 0849 05/03/23 1559  BP: (!) 93/51   111/60  Pulse: 73   70  Resp: 20   18  Temp: 97.7 F (36.5 C)   (!) 97.5 F (36.4 C)  TempSrc:      SpO2: 94% (!) 87% 92% 91%  Weight:      Height:       General.  Frail elderly man, in no acute distress. Pulmonary.  Lungs clear bilaterally, normal respiratory effort. CV.  Regular rate and rhythm, no JVD, rub or murmur. Abdomen.  Soft, nontender, nondistended, BS positive. CNS.  Alert and oriented .  No focal neurologic deficit. Extremities.  No edema, no cyanosis, pulses intact and symmetrical. Psychiatry.  Judgment and insight appears normal.    Data Reviewed: Prior data reviewed  Family Communication:   Disposition: Status is: Inpatient Remains inpatient appropriate because: Severity of illness  Planned Discharge Destination: To be determined  DVT prophylaxis.  Lovenox Time spent: 44 minutes  This record has been created using Conservation officer, historic buildings. Errors have been sought and corrected,but may not always be located. Such creation errors do not reflect on the standard of care.   Author: Arnetha Courser, MD 05/03/2023 4:23 PM  For on call review [email or web address].

## 2023-05-04 DIAGNOSIS — J9601 Acute respiratory failure with hypoxia: Secondary | ICD-10-CM | POA: Diagnosis not present

## 2023-05-04 DIAGNOSIS — I10 Essential (primary) hypertension: Secondary | ICD-10-CM

## 2023-05-04 DIAGNOSIS — R338 Other retention of urine: Secondary | ICD-10-CM | POA: Diagnosis not present

## 2023-05-04 DIAGNOSIS — R531 Weakness: Secondary | ICD-10-CM

## 2023-05-04 DIAGNOSIS — R339 Retention of urine, unspecified: Secondary | ICD-10-CM | POA: Diagnosis not present

## 2023-05-04 DIAGNOSIS — J189 Pneumonia, unspecified organism: Secondary | ICD-10-CM | POA: Diagnosis not present

## 2023-05-04 LAB — CULTURE, BLOOD (ROUTINE X 2): Special Requests: ADEQUATE

## 2023-05-04 LAB — GLUCOSE, CAPILLARY
Glucose-Capillary: 240 mg/dL — ABNORMAL HIGH (ref 70–99)
Glucose-Capillary: 321 mg/dL — ABNORMAL HIGH (ref 70–99)

## 2023-05-04 MED ORDER — INSULIN GLARGINE-YFGN 100 UNIT/ML ~~LOC~~ SOLN
10.0000 [IU] | Freq: Every day | SUBCUTANEOUS | Status: DC
  Administered 2023-05-04: 10 [IU] via SUBCUTANEOUS
  Filled 2023-05-04: qty 0.1

## 2023-05-04 MED ORDER — CEFUROXIME AXETIL 500 MG PO TABS
500.0000 mg | ORAL_TABLET | Freq: Two times a day (BID) | ORAL | Status: DC
  Filled 2023-05-04: qty 1

## 2023-05-04 MED ORDER — METOPROLOL SUCCINATE ER 25 MG PO TB24: 25.0000 mg | ORAL_TABLET | Freq: Every day | ORAL | 1 refills | Status: AC

## 2023-05-04 MED ORDER — AZITHROMYCIN 500 MG PO TABS: 500.0000 mg | ORAL_TABLET | Freq: Every day | ORAL | 0 refills | Status: AC

## 2023-05-04 MED ORDER — CEFUROXIME AXETIL 500 MG PO TABS: 500.0000 mg | ORAL_TABLET | Freq: Two times a day (BID) | ORAL | 0 refills | Status: AC

## 2023-05-04 MED ORDER — LISINOPRIL 40 MG PO TABS: 20.0000 mg | ORAL_TABLET | Freq: Every day | ORAL | Status: DC

## 2023-05-04 NOTE — Discharge Summary (Signed)
Physician Discharge Summary   [name] MRN: [id number] DOB: 1940/12/09  Admit date:     05/01/2023  Discharge date: 05/04/23  Discharge Physician: Arnetha Courser   PCP: Dortha Kern, MD   Recommendations at discharge:  Please obtain CBC and BMP in 1 week Follow-up with primary care provider Follow-up with urology  Discharge Diagnoses: Principal Problem:   Pneumonia due to infectious organism Active Problems:   Acute hypoxemic respiratory failure (HCC)   Urinary retention   BPH (benign prostatic hyperplasia)   Generalized weakness   Hypertension   Type 2 diabetes mellitus, without long-term current use of insulin (HCC)   History of cardiac pacemaker   NSVT (nonsustained ventricular tachycardia) (HCC)   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 04/22/23   Chronic anemia   Chronic obstructive pulmonary disease (COPD) [facility])   Hospital Course: Taken from H&P.  Wesley Wilson is a 82 y.o. male with medical history significant for intermittent AV block s/p PPM, NSVT, HTN, HLD, BPH,, T2DM (on trulicity), prior alcohol use, and prior smoker (80-pack year) , hospitalized at [facility] from 7/12 to 7/19 for with respiratory failure secondary to COPD exacerbation, requiring 2 to 3 L, weaned off O2 prior to discharge, who presents to the ED with weakness and a fall, high blood sugars at home as well as urinary retention.   During his recent stay at [facility] he had myalgias and conjunctivitis and was RMSF positive(titer of 1: 128) and discharged with doxycycline.  During workup in the ED, a postvoid residual was done with 525 mL. While in the ED he dropped his O2 sat to the mid 80s and was placed on O2 at 2L ED course and data review: On arrival afebrile, BP 123/67, tachycardic to 103, tachypneic to 24 with O2 sat 87% on room air.  Labs with WBC 13,000, hemoglobin 12, up from 11.2 at discharge on 7/16, lactic acid 1.1.  Troponin and BNP not done.  Urinalysis unremarkable.  Chest x-ray  consistent with pneumonia as follows: IMPRESSION: 1. New patchy airspace opacities in both lower lungs concerning for pneumonia. 2. Stable pleural-parenchymal scarring/opacities.   Patient started on Rocephin and azithromycin He had a Foley placed in the ED.  7/22: Vital stable.  BNP elevated at 522, procalcitonin at 1.12.  Will encourage p.o. hydration and avoid IV fluid.  Most likely will discharge his Foley catheter in place and follow-up with urology closely for further recommendations and voiding trial.  7/23: Continue to require 2 L of oxygen, softer blood pressure holding morning dose of metoprolol and lisinopril.  Repeat chest x-ray stable.    7/24:PT recommending SNF, patient declined rehab and wants to go home, home health services ordered.  Patient continued to require 2 L of oxygen especially with ambulation.  DME oxygen was ordered.  He is being discharged on 2 more days of antibiotics.  Patient is being discharged with Foley catheter in place.  Per patient he already made an appointment with his urologist at [facility] for July 31.  They can give him a voiding trial as outpatient and decide about when to remove Foley.  Patient will continue the rest of his home medications and need to have a close follow-up with his providers for further recommendations.  Assessment and Plan: * Pneumonia due to infectious organism Acute  hypoxemic respiratory failure COPD Patient has shortness of breath, elevated WBC and chest x-ray showing bilateral airspace disease O2 sats 87% improving to the low to mid 90s with  2L (PFTs 04/2023) with an FEV1/FVC of 64 % and an FEV1 of 55% Ceftriaxone was switched with Ceftin DuoNebs as needed Continue home inhalers Antitussives, incentive spirometry   Acute hypoxemic respiratory failure (HCC) With no baseline oxygen use.  Likely secondary to pneumonia. Repeat chest x-ray stable. -Continue supplemental oxygen-wean as tolerated  Urinary  retention BPH Finasteride and Flomax  Continue Foley-likely be discharged with Foley and have outpatient urology evaluation.  Generalized weakness Secondary to acute illness, physical deconditioning, recent hospitalization with COPD , RMSF Treat contributing conditions PT and TOC consult-PT recommending SNF  Hypertension Blood pressure on softer side this morning, holding morning dose of metoprolol and lisinopril Continue metoprolol and lisinopril-if blood pressure remained stable or elevated  Type 2 diabetes mellitus, without long-term current use of insulin (HCC) Continue Lantus 40 U Sliding scale coverage  History of cardiac pacemaker Myocardial perfusion study was obtained (01/2023) that was normal without evidence of ischemia or scar Echo 04/2023 showed LVEF > 55%, GIIDD, mild to moderate MVR.  Continue metoprolol, lisinopril, SGLT2i   NSVT (nonsustained ventricular tachycardia) (HCC) taking metoprolol 50mg  daily.   History of [facility] spotted fever 04/22/23 Patient had conjunctivitis and myalgias in July 2017  Zoonotic workup  returned positive for RMSF with titer of 1:128.  Treated empirically with doxycycline for 7 day course.  For repeat titer around 8/1-8/7.   Chronic anemia Stable  Chronic obstructive pulmonary disease (COPD) (HCC) No wheezing. -Continue home bronchodilators   Consultants: None Procedures performed: None Disposition: Home Diet recommendation:  Discharge Diet Orders (From admission, onward)     Start     Ordered   05/04/23 0000  Diet - low sodium heart healthy        05/04/23 1256            DISCHARGE MEDICATION: Allergies as of 05/04/2023   No Known Allergies      Medication List     TAKE these medications    acetaminophen 500 MG tablet Commonly known as: TYLENOL Take by mouth.   albuterol 108 (90 Base) MCG/ACT inhaler Commonly known as: VENTOLIN HFA Inhale 2 puffs into the lungs every 6 (six) hours as needed for  wheezing or shortness of breath.   atorvastatin 20 MG tablet Commonly known as: LIPITOR Take 20 mg by mouth daily.   azithromycin 500 MG tablet Commonly known as: ZITHROMAX Take 1 tablet (500 mg total) by mouth daily at 6 PM for 2 days.   cefUROXime 500 MG tablet Commonly known as: CEFTIN Take 1 tablet (500 mg total) by mouth 2 (two) times daily with a meal for 4 doses.   chlordiazePOXIDE 10 MG capsule Commonly known as: LIBRIUM Take 10 mg by mouth 3 (three) times daily as needed for anxiety.   fluticasone-salmeterol 250-50 MCG/ACT Aepb Commonly known as: ADVAIR Inhale 1 puff into the lungs in the morning and at bedtime.   Jardiance 25 MG Tabs tablet Generic drug: empagliflozin Take 25 mg by mouth daily.   lisinopril 40 MG tablet Commonly known as: ZESTRIL Take 0.5 tablets (20 mg total) by mouth daily. What changed: how much to take   metoprolol succinate 25 MG 24 hr tablet Commonly known as: TOPROL-XL Take 1 tablet (25 mg total) by mouth daily. What changed:  medication strength how much to take   tadalafil 5 MG tablet Commonly known as: CIALIS Take 5 mg by mouth daily.   tamsulosin 0.4 MG Caps capsule Commonly known as: FLOMAX TAKE TWO CAPSULES BY MOUTH EVERY  EVENING   Trulicity 1.5 MG/0.5ML Sopn Generic drug: Dulaglutide INJECT 0.5MLS SUBCUTANEOUSLY ONCE A WEEK IN ABDOMEN, THIGH OR UPPER ARM. ROTATING INJECTION SITES   umeclidinium bromide 62.5 MCG/ACT Aepb Commonly known as: INCRUSE ELLIPTA Inhale 1 puff into the lungs daily.               Durable Medical Equipment  (From admission, onward)           Start     Ordered   05/04/23 1336  For home use only DME Bedside commode  Once       Question:  Patient needs a bedside commode to treat with the following condition  Answer:  Generalized weakness   05/04/23 1335   05/04/23 1147  For home use only DME oxygen  Once       Question Answer Comment  Length of Need Lifetime   Mode or (Route) Nasal  cannula   Liters per Minute 2   Frequency Continuous (stationary and portable oxygen unit needed)   Oxygen conserving device Yes   Oxygen delivery system Gas      05/04/23 1147            Follow-up Information     Dortha Kern, MD. Schedule an appointment as soon as possible for a visit in 1 week(s).   Specialty: Family Medicine Contact information: [address] [place] [address] [phone]                Discharge Exam: Ceasar Mons Weights   05/01/23 1741 05/02/23 0100  Weight: 100.7 kg 96.5 kg   General.  Frail elderly man, in no acute distress. Pulmonary.  Lungs clear bilaterally, normal respiratory effort. CV.  Regular rate and rhythm, no JVD, rub or murmur. Abdomen.  Soft, nontender, nondistended, BS positive. CNS.  Alert and oriented .  No focal neurologic deficit. Extremities.  No edema, no cyanosis, pulses intact and symmetrical. Psychiatry.  Judgment and insight appears normal.   Condition at discharge: stable  The results of significant diagnostics from this hospitalization (including imaging, microbiology, ancillary and laboratory) are listed below for reference.   Imaging Studies: DG Chest Port 1 View  Result Date: 05/03/2023 CLINICAL DATA:  Shortness of breath EXAM: PORTABLE CHEST 1 VIEW COMPARISON:  Prior chest x-ray 05/01/2023 FINDINGS: Left subclavian approach cardiac rhythm maintenance device. Leads terminate in the right atrium and right ventricle. Extensive diffuse bronchitic changes bilaterally similar [place] prior images. No new airspace opacity, pleural effusion or pneumothorax. Stable bibasilar atelectasis versus scarring. A IMPRESSION: Stable chest x-ray without evidence of acute cardiopulmonary process. Stable chronic bronchitic changes and bibasilar scarring/atelectasis. Electronically Signed   By: Malachy Moan M.D.   On: 05/03/2023 13:34   DG Chest Portable 1 View  Result Date: 05/01/2023 CLINICAL DATA:  Hypoxia EXAM:  PORTABLE CHEST 1 VIEW COMPARISON:  Chest x-ray 11/24/2018.  CT of the chest 06/14/2019 FINDINGS: Pleural-parenchymal scarring/opacities are stable from prior. There are new patchy airspace opacities in both lower lungs. Costophrenic angles are clear. Cardiomediastinal silhouette is within normal limits. Left-sided pacemaker is present. No acute fractures are seen. IMPRESSION: 1. New patchy airspace opacities in both lower lungs concerning for pneumonia. 2. Stable pleural-parenchymal scarring/opacities. Electronically Signed   By: Darliss Cheney M.D.   On: 05/01/2023 18:50    Microbiology: Results for orders placed or performed during the hospital encounter of 05/01/23  Culture, blood (Routine x 2)     Status: None (Preliminary result)   Collection Time: 05/01/23  5:56  PM   Specimen: BLOOD  Result Value Ref Range Status   Specimen Description BLOOD BLOOD LEFT WRIST  Final   Special Requests   Final    BOTTLES DRAWN AEROBIC AND ANAEROBIC Blood Culture adequate volume   Culture   Final    NO GROWTH 3 DAYS Performed at [facility], [address] [place], [place] [address]    Report Status PENDING  Incomplete  Culture, blood (Routine x 2)     Status: None (Preliminary result)   Collection Time: 05/01/23  6:01 PM   Specimen: BLOOD  Result Value Ref Range Status   Specimen Description BLOOD RIGHT ANTECUBITAL  Final   Special Requests   Final    BOTTLES DRAWN AEROBIC AND ANAEROBIC Blood Culture adequate volume   Culture   Final    NO GROWTH 3 DAYS Performed at [facility], [address] [place], [place] [address]    Report Status PENDING  Incomplete    Labs: CBC: Recent Labs  Lab 05/01/23 1756 05/02/23 0706  WBC 13.7* 12.0*  NEUTROABS 13.0*  --   HGB 12.2* 11.9*  HCT 36.5* 35.2*  MCV 88.0 88.4  PLT 344 318   Basic Metabolic Panel: Recent Labs  Lab 05/01/23 1756 05/02/23 0706  NA 134* 132*  K 3.7 4.3  CL 106 101  CO2 21* 24  GLUCOSE 222* 139*  BUN  19 24*  CREATININE 1.16 1.26*  CALCIUM 7.7* 7.8*   Liver Function Tests: Recent Labs  Lab 05/01/23 1756  AST 20  ALT 49*  ALKPHOS 90  BILITOT 0.9  PROT 6.2*  ALBUMIN 2.7*   CBG: Recent Labs  Lab 05/03/23 0802 05/03/23 1138 05/03/23 2037 05/04/23 0800 05/04/23 1141  GLUCAP 199* 290* 335* 240* 321*    Discharge time spent: greater than 30 minutes.  This record has been created using Conservation officer, historic buildings. Errors have been sought and corrected,but may not always be located. Such creation errors do not reflect on the standard of care.   Signed: Arnetha Courser, MD [facility] 05/04/2023

## 2023-05-04 NOTE — Progress Notes (Signed)
OT Cancellation Note  Patient Details Name: JAIDEN DINKINS MRN: [id number] DOB: [birth year]   Cancelled Treatment:    Reason Eval/Treat Not Completed: Patient declined, no reason specified (patient wanting to visit with friend at bedside). Will attempt later.  Alvester Morin 05/04/2023, 3:01 PM

## 2023-05-04 NOTE — TOC Progression Note (Signed)
Transition of Care [facility]) - Progression Note    Patient Details  Name: Wesley Wilson MRN: [id number] Date of Birth: Oct 13, 1940  Transition of Care [facility]) CM/SW Contact  Garret Reddish, RN Phone Number: 05/04/2023, 10:00 AM  Clinical Narrative:   I have spoken with [name].  I have spoken to him about the recommendation for SNF on discharge.  Patient reports that he has home health prior to admission and would like to return home with home health services.    TOC will continue to follow for discharge planning.        Expected Discharge Plan: Home w Home Health Services Barriers to Discharge: No Barriers Identified  Expected Discharge Plan and Services   Discharge Planning Services: CM Consult                                           Social Determinants of Health (SDOH) Interventions SDOH Screenings   Food Insecurity: No Food Insecurity (05/02/2023)  Housing: Low Risk  (05/02/2023)  Transportation Needs: No Transportation Needs (05/02/2023)  Utilities: Not At Risk (05/02/2023)  Financial Resource Strain: Low Risk  (04/24/2023)   Received from [facility]  Tobacco Use: Medium Risk (05/02/2023)    Readmission Risk Interventions     No data to display

## 2023-05-04 NOTE — TOC Transition Note (Signed)
Transition of Care [facility]) - CM/SW Discharge Note   Patient Details  Name: Wesley Wilson MRN: [id number] Date of Birth: Feb 18, 1941  Transition of Care [facility]) CM/SW Contact:  Garret Reddish, RN Phone Number: 05/04/2023, 1:09 PM   Clinical Narrative:   Chart reviewed.  Noted that patient has orders for discharge today.  Provider has informed me that patient will need home 02 and home health on discharge.   I have discussed the SNF recommendation with patient and he has declined.  Patient is agreeable to go home with home health services.   Patient is active with [facility] home [facility].  I have informed Elnita Maxwell with [facility] home health orders will be for RN, for new 02, foley catheter care, PT, OT and in home aide.    I have given home 02 referral to John with [facility].   Patient will require home 02 on discharge.    I have informed staff nurse of the above information.       Final next level of care: Home w Home Health Services Barriers to Discharge: No Barriers Identified   Patient Goals and CMS Choice CMS Medicare.gov Compare Post Acute Care list provided to:: Patient Choice offered to / list presented to : Patient  Discharge Placement                      Patient and family notified of of transfer: 05/04/23  Discharge Plan and Services Additional resources added to the After Visit Summary for     Discharge Planning Services: CM Consult            DME Arranged: Oxygen DME Agency: [facility] Date DME Agency Contacted: 05/04/23 Time DME Agency Contacted: 1100 Representative spoke with at DME Agency: Jonny Ruiz HH Arranged: RN, PT, OT, Nurse's Aide HH Agency: [facility] Services (Patient is active with [facility]) Date HH Agency Contacted: 05/04/23 Time HH Agency Contacted: 1100 Representative spoke with at [facility] Agency: Elnita Maxwell  Social Determinants of Health (SDOH) Interventions SDOH Screenings   Food Insecurity: No Food Insecurity (05/02/2023)  Housing: Low Risk   (05/02/2023)  Transportation Needs: No Transportation Needs (05/02/2023)  Utilities: Not At Risk (05/02/2023)  Financial Resource Strain: Low Risk  (04/24/2023)   Received from [facility]  Tobacco Use: Medium Risk (05/02/2023)     Readmission Risk Interventions     No data to display

## 2023-05-04 NOTE — Progress Notes (Signed)
Patient is not able to walk the distance required to go the bathroom, or he/she is unable to safely negotiate stairs required to access the bathroom.  A 3in1 BSC will alleviate this problem  

## 2023-05-04 NOTE — Care Management Important Message (Signed)
Important Message  Patient Details  Name: Wesley Wilson MRN: [id number] Date of Birth: [birth year]   Medicare Important Message Given:  Yes     Olegario Messier A  Grosser 05/04/2023, 11:01 AM

## 2023-05-04 NOTE — Discharge Summary (Signed)
Physician Discharge Summary   [name] MRN: [id number] DOB: [birth year]  Admit date:     05/01/2023  Discharge date: 05/04/23  Discharge Physician: Arnetha Courser   PCP: Dortha Kern, MD   Recommendations at discharge:  Please obtain CBC and BMP in 1 week Follow-up with primary care provider Follow-up with urology  Discharge Diagnoses: Principal Problem:   Pneumonia due to infectious organism Active Problems:   Acute hypoxemic respiratory failure (HCC)   Urinary retention   BPH (benign prostatic hyperplasia)   Generalized weakness   Hypertension   Type 2 diabetes mellitus, without long-term current use of insulin (HCC)   History of cardiac pacemaker   NSVT (nonsustained ventricular tachycardia) (HCC)   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 04/22/23   Chronic anemia   Chronic obstructive pulmonary disease (COPD) [facility])   Hospital Course: Taken from H&P.  Wesley Wilson is a 82 y.o. male with medical history significant for intermittent AV block s/p PPM, NSVT, HTN, HLD, BPH,, T2DM (on trulicity), prior alcohol use, and prior smoker (80-pack year) , hospitalized at [facility] from 7/12 to 7/19 for with respiratory failure secondary to COPD exacerbation, requiring 2 to 3 L, weaned off O2 prior to discharge, who presents to the ED with weakness and a fall, high blood sugars at home as well as urinary retention.   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  During workup in the ED, a postvoid residual was done with 525 mL. While in the ED he dropped his O2 sat to the mid 80s and was placed on O2 at 2L ED course and data review: On arrival afebrile, BP 123/67, tachycardic to 103, tachypneic to 24 with O2 sat 87% on room air.  Labs with WBC 13,000, hemoglobin 12, up from 11.2 at discharge on 7/16, lactic acid 1.1.  Troponin and BNP not done.  Urinalysis unremarkable.  Chest x-ray  consistent with pneumonia as follows: IMPRESSION: 1. New patchy airspace opacities in both lower lungs concerning for pneumonia. 2. Stable pleural-parenchymal scarring/opacities.   Patient started on Rocephin and azithromycin He had a Foley placed in the ED.  7/22: Vital stable.  BNP elevated at 522, procalcitonin at 1.12.  Will encourage p.o. hydration and avoid IV fluid.  Most likely will discharge his Foley catheter in place and follow-up with urology closely for further recommendations and voiding trial.  7/23: Continue to require 2 L of oxygen, softer blood pressure holding morning dose of metoprolol and lisinopril.  Repeat chest x-ray stable.    7/24:PT recommending SNF, patient declined rehab and wants to go home, home health services ordered.  Patient continued to require 2 L of oxygen especially with ambulation.  DME oxygen was ordered.  He is being discharged on 2 more days of antibiotics.  Patient is being discharged with Foley catheter in place.  Per patient he already made an appointment with his urologist at [facility] for July 31.  They can give him a voiding trial as outpatient and decide about when to remove Foley.  Patient will continue the rest of his home medications and need to have a close follow-up with his providers for further recommendations.  Assessment and Plan: * Pneumonia due to infectious organism Acute  hypoxemic respiratory failure COPD Patient has shortness of breath, elevated WBC and chest x-ray showing bilateral airspace disease O2 sats 87% improving to the low to mid 90s with  2L (PFTs 04/2023) with an FEV1/FVC of 64 % and an FEV1 of 55% Ceftriaxone was switched with Ceftin DuoNebs as needed Continue home inhalers Antitussives, incentive spirometry   Acute hypoxemic respiratory failure (HCC) With no baseline oxygen use.  Likely secondary to pneumonia. Repeat chest x-ray stable. -Continue supplemental oxygen-wean as tolerated  Urinary  retention BPH Finasteride and Flomax  Continue Foley-likely be discharged with Foley and have outpatient urology evaluation.  Generalized weakness Secondary to acute illness, physical deconditioning, recent hospitalization with COPD , RMSF Treat contributing conditions PT and TOC consult-PT recommending SNF  Hypertension Blood pressure on softer side this morning, holding morning dose of metoprolol and lisinopril Continue metoprolol and lisinopril-if blood pressure remained stable or elevated  Type 2 diabetes mellitus, without long-term current use of insulin (HCC) Continue Lantus 40 U Sliding scale coverage  History of cardiac pacemaker Myocardial perfusion study was obtained (01/2023) that was normal without evidence of ischemia or scar Echo 04/2023 showed LVEF > 55%, GIIDD, mild to moderate MVR.  Continue metoprolol, lisinopril, SGLT2i   NSVT (nonsustained ventricular tachycardia) (HCC) taking metoprolol 50mg  daily.   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  Treated empirically with doxycycline for 7 day course.  For repeat titer around 8/1-8/7.   Chronic anemia Stable  Chronic obstructive pulmonary disease (COPD) (HCC) No wheezing. -Continue home bronchodilators   Consultants: None Procedures performed: None Disposition: Home Diet recommendation:  Discharge Diet Orders (From admission, onward)     Start     Ordered   05/04/23 0000  Diet - low sodium heart healthy        05/04/23 1256            DISCHARGE MEDICATION: Allergies as of 05/04/2023   No Known Allergies      Medication List     TAKE these medications    acetaminophen 500 MG tablet Commonly known as: TYLENOL Take by mouth.   albuterol 108 (90 Base) MCG/ACT inhaler Commonly known as: VENTOLIN HFA Inhale 2 puffs into the lungs every 6 (six) hours as needed for  wheezing or shortness of breath.   atorvastatin 20 MG tablet Commonly known as: LIPITOR Take 20 mg by mouth daily.   azithromycin 500 MG tablet Commonly known as: ZITHROMAX Take 1 tablet (500 mg total) by mouth daily at 6 PM for 2 days.   cefUROXime 500 MG tablet Commonly known as: CEFTIN Take 1 tablet (500 mg total) by mouth 2 (two) times daily with a meal for 4 doses.   chlordiazePOXIDE 10 MG capsule Commonly known as: LIBRIUM Take 10 mg by mouth 3 (three) times daily as needed for anxiety.   fluticasone-salmeterol 250-50 MCG/ACT Aepb Commonly known as: ADVAIR Inhale 1 puff into the lungs in the morning and at bedtime.   Jardiance 25 MG Tabs tablet Generic drug: empagliflozin Take 25 mg by mouth daily.   lisinopril 40 MG tablet Commonly known as: ZESTRIL Take 40 mg by mouth daily.   metoprolol succinate 50 MG 24 hr tablet Commonly known as: TOPROL-XL Take 1 tablet by mouth daily.   tadalafil 5 MG tablet Commonly known as: CIALIS Take 5 mg by mouth daily.   tamsulosin 0.4 MG Caps capsule Commonly known as: FLOMAX TAKE TWO CAPSULES BY MOUTH EVERY EVENING   Trulicity 1.5 MG/0.5ML Sopn Generic drug: Dulaglutide INJECT 0.5MLS SUBCUTANEOUSLY ONCE A WEEK IN ABDOMEN, THIGH OR UPPER  ARM. ROTATING INJECTION SITES   umeclidinium bromide 62.5 MCG/ACT Aepb Commonly known as: INCRUSE ELLIPTA Inhale 1 puff into the lungs daily.               Durable Medical Equipment  (From admission, onward)           Start     Ordered   05/04/23 1147  For home use only DME oxygen  Once       Question Answer Comment  Length of Need Lifetime   Mode or (Route) Nasal cannula   Liters per Minute 2   Frequency Continuous (stationary and portable oxygen unit needed)   Oxygen conserving device Yes   Oxygen delivery system Gas      05/04/23 1147            Follow-up Information     Dortha Kern, MD. Schedule an appointment as soon as possible for a visit in 1 week(s).    Specialty: Family Medicine Contact information: [address] [place] [address] [phone]                Discharge Exam: Ceasar Mons Weights   05/01/23 1741 05/02/23 0100  Weight: 100.7 kg 96.5 kg   General.  Frail elderly man, in no acute distress. Pulmonary.  Lungs clear bilaterally, normal respiratory effort. CV.  Regular rate and rhythm, no JVD, rub or murmur. Abdomen.  Soft, nontender, nondistended, BS positive. CNS.  Alert and oriented .  No focal neurologic deficit. Extremities.  No edema, no cyanosis, pulses intact and symmetrical. Psychiatry.  Judgment and insight appears normal.   Condition at discharge: stable  The results of significant diagnostics from this hospitalization (including imaging, microbiology, ancillary and laboratory) are listed below for reference.   Imaging Studies: DG Chest Port 1 View  Result Date: 05/03/2023 CLINICAL DATA:  Shortness of breath EXAM: PORTABLE CHEST 1 VIEW COMPARISON:  Prior chest x-ray 05/01/2023 FINDINGS: Left subclavian approach cardiac rhythm maintenance device. Leads terminate in the right atrium and right ventricle. Extensive diffuse bronchitic changes bilaterally similar [place] prior images. No new airspace opacity, pleural effusion or pneumothorax. Stable bibasilar atelectasis versus scarring. A IMPRESSION: Stable chest x-ray without evidence of acute cardiopulmonary process. Stable chronic bronchitic changes and bibasilar scarring/atelectasis. Electronically Signed   By: Malachy Moan M.D.   On: 05/03/2023 13:34   DG Chest Portable 1 View  Result Date: 05/01/2023 CLINICAL DATA:  Hypoxia EXAM: PORTABLE CHEST 1 VIEW COMPARISON:  Chest x-ray 11/24/2018.  CT of the chest 06/14/2019 FINDINGS: Pleural-parenchymal scarring/opacities are stable from prior. There are new patchy airspace opacities in both lower lungs. Costophrenic angles are clear. Cardiomediastinal silhouette is within normal limits. Left-sided  pacemaker is present. No acute fractures are seen. IMPRESSION: 1. New patchy airspace opacities in both lower lungs concerning for pneumonia. 2. Stable pleural-parenchymal scarring/opacities. Electronically Signed   By: Darliss Cheney M.D.   On: 05/01/2023 18:50    Microbiology: Results for orders placed or performed during the hospital encounter of 05/01/23  Culture, blood (Routine x 2)     Status: None (Preliminary result)   Collection Time: 05/01/23  5:56 PM   Specimen: BLOOD  Result Value Ref Range Status   Specimen Description BLOOD BLOOD LEFT WRIST  Final   Special Requests   Final    BOTTLES DRAWN AEROBIC AND ANAEROBIC Blood Culture adequate volume   Culture   Final    NO GROWTH 3 DAYS Performed at [facility], [address] [place],  [place] [address]    Report Status PENDING  Incomplete  Culture, blood (Routine x 2)     Status: None (Preliminary result)   Collection Time: 05/01/23  6:01 PM   Specimen: BLOOD  Result Value Ref Range Status   Specimen Description BLOOD RIGHT ANTECUBITAL  Final   Special Requests   Final    BOTTLES DRAWN AEROBIC AND ANAEROBIC Blood Culture adequate volume   Culture   Final    NO GROWTH 3 DAYS Performed at [facility], [address] [place], [place] [address]    Report Status PENDING  Incomplete    Labs: CBC: Recent Labs  Lab 05/01/23 1756 05/02/23 0706  WBC 13.7* 12.0*  NEUTROABS 13.0*  --   HGB 12.2* 11.9*  HCT 36.5* 35.2*  MCV 88.0 88.4  PLT 344 318   Basic Metabolic Panel: Recent Labs  Lab 05/01/23 1756 05/02/23 0706  NA 134* 132*  K 3.7 4.3  CL 106 101  CO2 21* 24  GLUCOSE 222* 139*  BUN 19 24*  CREATININE 1.16 1.26*  CALCIUM 7.7* 7.8*   Liver Function Tests: Recent Labs  Lab 05/01/23 1756  AST 20  ALT 49*  ALKPHOS 90  BILITOT 0.9  PROT 6.2*  ALBUMIN 2.7*   CBG: Recent Labs  Lab 05/03/23 0802 05/03/23 1138 05/03/23 2037 05/04/23 0800 05/04/23 1141  GLUCAP 199* 290* 335* 240*  321*    Discharge time spent: greater than 30 minutes.  This record has been created using Conservation officer, historic buildings. Errors have been sought and corrected,but may not always be located. Such creation errors do not reflect on the standard of care.   Signed: Arnetha Courser, MD [facility] 05/04/2023

## 2023-05-04 NOTE — Discharge Instructions (Addendum)
You have been discharged with Foley catheter.  Please follow-up with your urologist and they can decide when to remove it. Please decrease the dose of lisinopril to 20 mg and metoprolol to 25 mg, follow-up with your primary care doctor and they can resume your normal dose as appropriate.

## 2023-05-04 NOTE — Progress Notes (Signed)
Patients oxygen saturations 88% on room air while ambulating.

## 2023-05-04 NOTE — TOC Progression Note (Signed)
Transition of Care [facility]) - Progression Note    Patient Details  Name: Wesley Wilson MRN: [id number] Date of Birth: 1941-03-12  Transition of Care [facility]) CM/SW Contact  Garret Reddish, RN Phone Number: 05/04/2023, 2:06 PM  Clinical Narrative:  Patient had requested a bedside commode.  I have asked John with [employer] to provide patient with at bedside commode.       Expected Discharge Plan: Home w Home Health Services Barriers to Discharge: No Barriers Identified  Expected Discharge Plan and Services   Discharge Planning Services: CM Consult     Expected Discharge Date: 05/04/23               DME Arranged: Oxygen DME Agency: [employer] Date DME Agency Contacted: 05/04/23 Time DME Agency Contacted: 1100 Representative spoke with at DME Agency: Jonny Ruiz HH Arranged: RN, PT, OT, Nurse's Aide HH Agency: [facility] (Patient is active with [facility]) Date HH Agency Contacted: 05/04/23 Time HH Agency Contacted: 1100 Representative spoke with at [facility] Agency: Elnita Maxwell   Social Determinants of Health (SDOH) Interventions SDOH Screenings   Food Insecurity: No Food Insecurity (05/02/2023)  Housing: Low Risk  (05/02/2023)  Transportation Needs: No Transportation Needs (05/02/2023)  Utilities: Not At Risk (05/02/2023)  Financial Resource Strain: Low Risk  (04/24/2023)   Received from [facility]  Tobacco Use: Medium Risk (05/02/2023)    Readmission Risk Interventions     No data to display

## 2023-05-05 LAB — CULTURE, BLOOD (ROUTINE X 2)

## 2023-05-07 ENCOUNTER — Emergency Department
Admission: EM | Admit: 2023-05-07 | Discharge: 2023-05-07 | Disposition: A | Discharge status: Home / Self Care | Payer: Medicare HMO | Source: Home / Self Care | Attending: Emergency Medicine | Admitting: Emergency Medicine

## 2023-05-07 ENCOUNTER — Encounter: Payer: Self-pay | Admitting: Emergency Medicine

## 2023-05-07 ENCOUNTER — Other Ambulatory Visit: Payer: Self-pay

## 2023-05-07 DIAGNOSIS — T83098A Other mechanical complication of other indwelling urethral catheter, initial encounter: Secondary | ICD-10-CM | POA: Diagnosis not present

## 2023-05-07 DIAGNOSIS — Z79899 Other long term (current) drug therapy: Secondary | ICD-10-CM | POA: Insufficient documentation

## 2023-05-07 DIAGNOSIS — Y69 Unspecified misadventure during surgical and medical care: Secondary | ICD-10-CM | POA: Insufficient documentation

## 2023-05-07 DIAGNOSIS — T839XXA Unspecified complication of genitourinary prosthetic device, implant and graft, initial encounter: Secondary | ICD-10-CM

## 2023-05-07 DIAGNOSIS — J449 Chronic obstructive pulmonary disease, unspecified: Secondary | ICD-10-CM | POA: Insufficient documentation

## 2023-05-07 DIAGNOSIS — E119 Type 2 diabetes mellitus without complications: Secondary | ICD-10-CM | POA: Insufficient documentation

## 2023-05-07 DIAGNOSIS — I10 Essential (primary) hypertension: Secondary | ICD-10-CM | POA: Diagnosis not present

## 2023-05-07 DIAGNOSIS — Z7951 Long term (current) use of inhaled steroids: Secondary | ICD-10-CM | POA: Insufficient documentation

## 2023-05-07 HISTORY — DX: Type 2 diabetes mellitus without complications: E11.9

## 2023-05-07 HISTORY — DX: Essential (primary) hypertension: I10

## 2023-05-07 NOTE — Discharge Instructions (Signed)
Return to the ER for worsening symptoms, fever, Foley catheter issues or other concerns.

## 2023-05-07 NOTE — ED Triage Notes (Signed)
Pt sts concern for increasing pain and leaking around catheter that was placed s/t urinary retention. Denies any other s/s.

## 2023-05-07 NOTE — ED Provider Notes (Signed)
[facility] Provider Note    Event Date/Time   First MD Initiated Contact with Patient 05/07/23 [phone]     (approximate)   History   Urinary Catheter Problem   HPI  Wesley Wilson is a 82 y.o. male who presents to the ED from home with a chief complaint leaky Foley catheter and bladder spasms.  Patient reports Foley catheter was placed for urinary retention approximately 7 days ago.  He has been able to have an increase in bowel movements and feels like he dislodged his catheter while straining to have a bowel movement tonight.  Denies fever/chills, nausea, vomiting, hematuria or dizziness.     Past Medical History   Past Medical History:  Diagnosis Date   Diabetes mellitus without complication (HCC)    Hypertension      Active Problem List   Patient Active Problem List   Diagnosis Date Noted   History of cardiac pacemaker 05/01/2023   NSVT (nonsustained ventricular tachycardia) (HCC) 05/01/2023   History of [facility] spotted fever 04/22/23 05/01/2023   Urinary retention 05/01/2023   BPH (benign prostatic hyperplasia) 05/01/2023   Pneumonia due to infectious organism 05/01/2023   Chronic anemia 05/01/2023   Chronic obstructive pulmonary disease (COPD) (HCC) 05/01/2023   Generalized weakness 05/01/2023   Acute hypoxemic respiratory failure (HCC) 04/23/2023   History of ETOH abuse 11/17/2020   Hypertension 11/17/2020   Type 2 diabetes mellitus, without long-term current use of insulin (HCC) 11/17/2020     Past Surgical History  History reviewed. No pertinent surgical history.   Home Medications   Prior to Admission medications   Medication Sig Start Date End Date Taking? Authorizing Provider  acetaminophen (TYLENOL) 500 MG tablet Take by mouth. Patient not taking: Reported on 05/02/2023    [provider]  albuterol (PROVENTIL HFA;VENTOLIN HFA) 108 (90 Base) MCG/ACT inhaler Inhale 2 puffs into the lungs every 6 (six) hours  as needed for wheezing or shortness of breath. 11/05/18   Don Perking, [place], MD  atorvastatin (LIPITOR) 20 MG tablet Take 20 mg by mouth daily. 02/02/16   [provider]  chlordiazePOXIDE (LIBRIUM) 10 MG capsule Take 10 mg by mouth 3 (three) times daily as needed for anxiety. 12/04/18   [provider]  empagliflozin (JARDIANCE) 25 MG TABS tablet Take 25 mg by mouth daily.    [provider]  fluticasone-salmeterol (ADVAIR) 250-50 MCG/ACT AEPB Inhale 1 puff into the lungs in the morning and at bedtime. 04/29/23 04/28/24  [provider]  lisinopril (ZESTRIL) 40 MG tablet Take 0.5 tablets (20 mg total) by mouth daily. 05/04/23   Arnetha Courser, MD  metoprolol succinate (TOPROL-XL) 25 MG 24 hr tablet Take 1 tablet (25 mg total) by mouth daily. 05/04/23   Arnetha Courser, MD  tadalafil (CIALIS) 5 MG tablet Take 5 mg by mouth daily.    [provider]  tamsulosin (FLOMAX) 0.4 MG CAPS capsule TAKE TWO CAPSULES BY MOUTH EVERY EVENING 12/04/18   [provider]  TRULICITY 1.5 MG/0.5ML SOPN INJECT 0.5MLS SUBCUTANEOUSLY ONCE A WEEK IN ABDOMEN, THIGH OR UPPER ARM. ROTATING INJECTION SITES 11/20/18   [provider]  umeclidinium bromide (INCRUSE ELLIPTA) 62.5 MCG/ACT AEPB Inhale 1 puff into the lungs daily. 04/29/23   [provider]     Allergies  Patient has no known allergies.   Family History  History reviewed. No pertinent family history.   Physical Exam  Triage Vital Signs: ED Triage Vitals  Encounter Vitals Group  BP 05/07/23 0341 (!) 161/96     Systolic BP Percentile --      Diastolic BP Percentile --      Pulse Rate 05/07/23 0341 61     Resp 05/07/23 0341 20     Temp 05/07/23 0341 98.4 F (36.9 C)     Temp Source 05/07/23 0341 Oral     SpO2 05/07/23 0341 92 %     Weight 05/07/23 0338 212 lb (96.2 kg)     Height 05/07/23 0338 6\' 3"  (1.905 m)     Head Circumference --      Peak Flow --      Pain Score 05/07/23 0338 4      Pain Loc --      Pain Education --      Exclude from Growth Chart --     Updated Vital Signs: BP (!) 161/96 (BP Location: Right Arm)   Pulse 61   Temp 98.4 F (36.9 C) (Oral)   Resp 20   Ht 6\' 3"  (1.905 m)   Wt 96.2 kg   SpO2 92%   BMI 26.50 kg/m    General: Awake, no distress.  CV:  Good peripheral perfusion.  Resp:  Normal effort.  Abd:  Mild suprapubic tenderness to palpation without rebound or guarding.  No distention.  Other:  Actively urinating clear urine around Foley catheter.   ED Results / Procedures / Treatments  Labs (all labs ordered are listed, but only abnormal results are displayed) Labs Reviewed - No data to display   EKG  None   RADIOLOGY None   Official radiology report(s): No results found.   PROCEDURES:  Critical Care performed: No  Procedures   MEDICATIONS ORDERED IN ED: Medications - No data to display   IMPRESSION / MDM / ASSESSMENT AND PLAN / ED COURSE  I reviewed the triage vital signs and the nursing notes.                             82 year old male presenting with Foley catheter problem.  Nursing to troubleshoot Foley catheter.  If unable, will remove, +/- insert new one if patient unable to urinate freely.  Will reassess.  Patient's presentation is most consistent with acute, uncomplicated illness.  [id number] Nurse removed existing Foley catheter and replaced with new one.  No issues with new catheter.  Strict return precautions given.  Patient verbalizes understanding and agrees with plan of care.  FINAL CLINICAL IMPRESSION(S) / ED DIAGNOSES   Final diagnoses:  Foley catheter problem, initial encounter [facility])     Rx / DC Orders   ED Discharge Orders     None        Note:  This document was prepared using Dragon voice recognition software and may include unintentional dictation errors.   Irean Hong, MD 05/07/23 [phone]

## 2023-05-20 ENCOUNTER — Emergency Department: Payer: Medicare HMO

## 2023-05-20 ENCOUNTER — Inpatient Hospital Stay
Admission: EM | Admit: 2023-05-20 | Discharge: 2023-05-23 | DRG: 682 | Disposition: A | Discharge status: Home Health | Payer: Medicare HMO | Attending: Internal Medicine | Admitting: Internal Medicine

## 2023-05-20 ENCOUNTER — Encounter: Payer: Self-pay | Admitting: Internal Medicine

## 2023-05-20 ENCOUNTER — Other Ambulatory Visit: Payer: Self-pay

## 2023-05-20 DIAGNOSIS — R338 Other retention of urine: Secondary | ICD-10-CM | POA: Diagnosis present

## 2023-05-20 DIAGNOSIS — Z950 Presence of cardiac pacemaker: Secondary | ICD-10-CM

## 2023-05-20 DIAGNOSIS — N189 Chronic kidney disease, unspecified: Secondary | ICD-10-CM | POA: Diagnosis present

## 2023-05-20 DIAGNOSIS — Z7984 Long term (current) use of oral hypoglycemic drugs: Secondary | ICD-10-CM

## 2023-05-20 DIAGNOSIS — R531 Weakness: Secondary | ICD-10-CM

## 2023-05-20 DIAGNOSIS — J441 Chronic obstructive pulmonary disease with (acute) exacerbation: Secondary | ICD-10-CM | POA: Diagnosis present

## 2023-05-20 DIAGNOSIS — R39198 Other difficulties with micturition: Secondary | ICD-10-CM | POA: Diagnosis present

## 2023-05-20 DIAGNOSIS — N3080 Other cystitis without hematuria: Secondary | ICD-10-CM | POA: Diagnosis present

## 2023-05-20 DIAGNOSIS — R748 Abnormal levels of other serum enzymes: Secondary | ICD-10-CM | POA: Diagnosis present

## 2023-05-20 DIAGNOSIS — G4700 Insomnia, unspecified: Secondary | ICD-10-CM | POA: Diagnosis present

## 2023-05-20 DIAGNOSIS — N390 Urinary tract infection, site not specified: Secondary | ICD-10-CM

## 2023-05-20 DIAGNOSIS — J9601 Acute respiratory failure with hypoxia: Secondary | ICD-10-CM | POA: Diagnosis present

## 2023-05-20 DIAGNOSIS — E119 Type 2 diabetes mellitus without complications: Secondary | ICD-10-CM | POA: Diagnosis present

## 2023-05-20 DIAGNOSIS — Z87891 Personal history of nicotine dependence: Secondary | ICD-10-CM | POA: Diagnosis not present

## 2023-05-20 DIAGNOSIS — Z7985 Long-term (current) use of injectable non-insulin antidiabetic drugs: Secondary | ICD-10-CM

## 2023-05-20 DIAGNOSIS — R000 Tachycardia, unspecified: Secondary | ICD-10-CM | POA: Diagnosis present

## 2023-05-20 DIAGNOSIS — Z7951 Long term (current) use of inhaled steroids: Secondary | ICD-10-CM

## 2023-05-20 DIAGNOSIS — Z8042 Family history of malignant neoplasm of prostate: Secondary | ICD-10-CM | POA: Diagnosis not present

## 2023-05-20 DIAGNOSIS — R7401 Elevation of levels of liver transaminase levels: Secondary | ICD-10-CM | POA: Diagnosis present

## 2023-05-20 DIAGNOSIS — I10 Essential (primary) hypertension: Secondary | ICD-10-CM | POA: Diagnosis present

## 2023-05-20 DIAGNOSIS — R1030 Lower abdominal pain, unspecified: Secondary | ICD-10-CM | POA: Diagnosis present

## 2023-05-20 DIAGNOSIS — Z79899 Other long term (current) drug therapy: Secondary | ICD-10-CM | POA: Diagnosis not present

## 2023-05-20 DIAGNOSIS — F419 Anxiety disorder, unspecified: Secondary | ICD-10-CM | POA: Diagnosis present

## 2023-05-20 DIAGNOSIS — J449 Chronic obstructive pulmonary disease, unspecified: Secondary | ICD-10-CM | POA: Diagnosis present

## 2023-05-20 DIAGNOSIS — Z8249 Family history of ischemic heart disease and other diseases of the circulatory system: Secondary | ICD-10-CM

## 2023-05-20 DIAGNOSIS — N401 Enlarged prostate with lower urinary tract symptoms: Secondary | ICD-10-CM | POA: Diagnosis present

## 2023-05-20 DIAGNOSIS — N400 Enlarged prostate without lower urinary tract symptoms: Secondary | ICD-10-CM | POA: Diagnosis present

## 2023-05-20 DIAGNOSIS — R339 Retention of urine, unspecified: Secondary | ICD-10-CM | POA: Diagnosis present

## 2023-05-20 DIAGNOSIS — I700 Atherosclerosis of aorta: Secondary | ICD-10-CM | POA: Diagnosis present

## 2023-05-20 DIAGNOSIS — N179 Acute kidney failure, unspecified: Secondary | ICD-10-CM | POA: Diagnosis present

## 2023-05-20 DIAGNOSIS — F1010 Alcohol abuse, uncomplicated: Secondary | ICD-10-CM | POA: Diagnosis present

## 2023-05-20 DIAGNOSIS — F32A Depression, unspecified: Secondary | ICD-10-CM | POA: Diagnosis present

## 2023-05-20 DIAGNOSIS — E871 Hypo-osmolality and hyponatremia: Secondary | ICD-10-CM | POA: Diagnosis present

## 2023-05-20 DIAGNOSIS — F1011 Alcohol abuse, in remission: Secondary | ICD-10-CM | POA: Diagnosis present

## 2023-05-20 LAB — CBC WITH DIFFERENTIAL/PLATELET
Abs Immature Granulocytes: 0.03 10*3/uL (ref 0.00–0.07)
Basophils Absolute: 0 10*3/uL (ref 0.0–0.1)
Basophils Relative: 0 %
Eosinophils Absolute: 0 10*3/uL (ref 0.0–0.5)
Eosinophils Relative: 0 %
HCT: 37.1 % — ABNORMAL LOW (ref 39.0–52.0)
Hemoglobin: 12.1 g/dL — ABNORMAL LOW (ref 13.0–17.0)
Immature Granulocytes: 0 %
Lymphocytes Relative: 4 %
Lymphs Abs: 0.4 10*3/uL — ABNORMAL LOW (ref 0.7–4.0)
MCH: 29.2 pg (ref 26.0–34.0)
MCHC: 32.6 g/dL (ref 30.0–36.0)
MCV: 89.4 fL (ref 80.0–100.0)
Monocytes Absolute: 0.6 10*3/uL (ref 0.1–1.0)
Monocytes Relative: 6 %
Neutro Abs: 8.4 10*3/uL — ABNORMAL HIGH (ref 1.7–7.7)
Neutrophils Relative %: 90 %
Platelets: 222 10*3/uL (ref 150–400)
RBC: 4.15 MIL/uL — ABNORMAL LOW (ref 4.22–5.81)
RDW: 14.7 % (ref 11.5–15.5)
WBC: 9.4 10*3/uL (ref 4.0–10.5)
nRBC: 0 % (ref 0.0–0.2)

## 2023-05-20 LAB — COMPREHENSIVE METABOLIC PANEL
ALT: 82 U/L — ABNORMAL HIGH (ref 0–44)
AST: 77 U/L — ABNORMAL HIGH (ref 15–41)
Albumin: 2.7 g/dL — ABNORMAL LOW (ref 3.5–5.0)
Alkaline Phosphatase: 135 U/L — ABNORMAL HIGH (ref 38–126)
Anion gap: 11 (ref 5–15)
BUN: 42 mg/dL — ABNORMAL HIGH (ref 8–23)
CO2: 21 mmol/L — ABNORMAL LOW (ref 22–32)
Calcium: 8.1 mg/dL — ABNORMAL LOW (ref 8.9–10.3)
Chloride: 98 mmol/L (ref 98–111)
Creatinine, Ser: 2.34 mg/dL — ABNORMAL HIGH (ref 0.61–1.24)
GFR, Estimated: 27 mL/min — ABNORMAL LOW (ref >60)
Glucose, Bld: 301 mg/dL — ABNORMAL HIGH (ref 70–99)
Potassium: 5 mmol/L (ref 3.5–5.1)
Sodium: 130 mmol/L — ABNORMAL LOW (ref 135–145)
Total Bilirubin: 1.5 mg/dL — ABNORMAL HIGH (ref 0.3–1.2)
Total Protein: 6.7 g/dL (ref 6.5–8.1)

## 2023-05-20 LAB — URINALYSIS, W/ REFLEX TO CULTURE (INFECTION SUSPECTED)
Bacteria, UA: NONE SEEN
Bilirubin Urine: NEGATIVE
Glucose, UA: >=500 mg/dL — AB
Hgb urine dipstick: NEGATIVE
Ketones, ur: NEGATIVE mg/dL
Nitrite: NEGATIVE
Protein, ur: NEGATIVE mg/dL
RBC / HPF: >50 RBC/hpf (ref 0–5)
Specific Gravity, Urine: 1.027 (ref 1.005–1.030)
Squamous Epithelial / HPF: NONE SEEN /HPF (ref 0–5)
WBC, UA: >50 WBC/hpf (ref 0–5)
pH: 5 (ref 5.0–8.0)

## 2023-05-20 LAB — GLUCOSE, CAPILLARY
Glucose-Capillary: 263 mg/dL — ABNORMAL HIGH (ref 70–99)
Glucose-Capillary: 157 mg/dL — ABNORMAL HIGH (ref 70–99)

## 2023-05-20 LAB — LIPASE, BLOOD: Lipase: 279 U/L — ABNORMAL HIGH (ref 11–51)

## 2023-05-20 LAB — LACTIC ACID, PLASMA: Lactic Acid, Venous: 1.4 mmol/L (ref 0.5–1.9)

## 2023-05-20 MED ORDER — SODIUM CHLORIDE 0.9 % IV SOLN
2.0000 g | Freq: Once | INTRAVENOUS | Status: DC
  Filled 2023-05-20: qty 12.5

## 2023-05-20 MED ORDER — FINASTERIDE 5 MG PO TABS
5.0000 mg | ORAL_TABLET | Freq: Every day | ORAL | Status: DC
  Administered 2023-05-21 – 2023-05-23 (×3): 5 mg via ORAL
  Filled 2023-05-20 (×3): qty 1

## 2023-05-20 MED ORDER — SODIUM CHLORIDE 0.9 % IV BOLUS
1000.0000 mL | Freq: Once | INTRAVENOUS | Status: AC
  Administered 2023-05-20: 1000 mL via INTRAVENOUS

## 2023-05-20 MED ORDER — ALBUTEROL SULFATE (2.5 MG/3ML) 0.083% IN NEBU: 2.5000 mg | INHALATION_SOLUTION | Freq: Four times a day (QID) | RESPIRATORY_TRACT | Status: DC | PRN

## 2023-05-20 MED ORDER — HEPARIN SODIUM (PORCINE) 5000 UNIT/ML IJ SOLN
5000.0000 [IU] | Freq: Three times a day (TID) | INTRAMUSCULAR | Status: DC
  Administered 2023-05-20 – 2023-05-23 (×8): 5000 [IU] via SUBCUTANEOUS
  Filled 2023-05-20 (×8): qty 1

## 2023-05-20 MED ORDER — INSULIN ASPART 100 UNIT/ML IJ SOLN
0.0000 [IU] | Freq: Three times a day (TID) | INTRAMUSCULAR | Status: DC
  Administered 2023-05-20: 5 [IU] via SUBCUTANEOUS
  Administered 2023-05-21: 3 [IU] via SUBCUTANEOUS
  Administered 2023-05-21 (×2): 2 [IU] via SUBCUTANEOUS
  Administered 2023-05-22: 3 [IU] via SUBCUTANEOUS
  Administered 2023-05-22: 2 [IU] via SUBCUTANEOUS
  Administered 2023-05-22: 1 [IU] via SUBCUTANEOUS
  Administered 2023-05-23: 2 [IU] via SUBCUTANEOUS
  Filled 2023-05-20 (×9): qty 1

## 2023-05-20 MED ORDER — CHLORDIAZEPOXIDE HCL 10 MG PO CAPS
10.0000 mg | ORAL_CAPSULE | Freq: Three times a day (TID) | ORAL | Status: DC | PRN
  Administered 2023-05-20 – 2023-05-22 (×5): 10 mg via ORAL
  Filled 2023-05-20 (×5): qty 1

## 2023-05-20 MED ORDER — METOPROLOL TARTRATE 5 MG/5ML IV SOLN: 5.0000 mg | INTRAVENOUS | Status: DC | PRN

## 2023-05-20 MED ORDER — ACETAMINOPHEN 650 MG RE SUPP: 650.0000 mg | Freq: Four times a day (QID) | RECTAL | Status: DC | PRN

## 2023-05-20 MED ORDER — ACETAMINOPHEN 325 MG PO TABS: 650.0000 mg | ORAL_TABLET | Freq: Four times a day (QID) | ORAL | Status: DC | PRN

## 2023-05-20 MED ORDER — SODIUM CHLORIDE 0.9 % IV SOLN
2.0000 g | INTRAVENOUS | Status: DC
  Administered 2023-05-20 – 2023-05-22 (×3): 2 g via INTRAVENOUS
  Filled 2023-05-20 (×3): qty 20

## 2023-05-20 MED ORDER — TAMSULOSIN HCL 0.4 MG PO CAPS
0.4000 mg | ORAL_CAPSULE | Freq: Every day | ORAL | Status: DC
  Administered 2023-05-20 – 2023-05-22 (×3): 0.4 mg via ORAL
  Filled 2023-05-20 (×2): qty 1

## 2023-05-20 MED ORDER — CHLORDIAZEPOXIDE HCL 5 MG PO CAPS
10.0000 mg | ORAL_CAPSULE | Freq: Once | ORAL | Status: AC
  Administered 2023-05-20: 10 mg via ORAL
  Filled 2023-05-20: qty 2

## 2023-05-20 MED ORDER — SERTRALINE HCL 50 MG PO TABS
25.0000 mg | ORAL_TABLET | Freq: Every day | ORAL | Status: DC
  Administered 2023-05-20 – 2023-05-23 (×4): 25 mg via ORAL
  Filled 2023-05-20 (×4): qty 1

## 2023-05-20 MED ORDER — INSULIN ASPART 100 UNIT/ML IJ SOLN: 0.0000 [IU] | Freq: Every day | INTRAMUSCULAR | Status: DC

## 2023-05-20 MED ORDER — SODIUM CHLORIDE 0.9 % IV SOLN: INTRAVENOUS | Status: DC

## 2023-05-20 MED ORDER — SENNOSIDES-DOCUSATE SODIUM 8.6-50 MG PO TABS: 1.0000 | ORAL_TABLET | Freq: Every evening | ORAL | Status: DC | PRN

## 2023-05-20 MED ORDER — UMECLIDINIUM BROMIDE 62.5 MCG/ACT IN AEPB
1.0000 | INHALATION_SPRAY | Freq: Every day | RESPIRATORY_TRACT | Status: DC
  Administered 2023-05-21 – 2023-05-23 (×3): 1 via RESPIRATORY_TRACT
  Filled 2023-05-20: qty 7

## 2023-05-20 MED ORDER — MOMETASONE FURO-FORMOTEROL FUM 200-5 MCG/ACT IN AERO
2.0000 | INHALATION_SPRAY | Freq: Two times a day (BID) | RESPIRATORY_TRACT | Status: DC
  Administered 2023-05-20 – 2023-05-23 (×5): 2 via RESPIRATORY_TRACT
  Filled 2023-05-20: qty 8.8

## 2023-05-20 MED ORDER — ONDANSETRON HCL 4 MG PO TABS: 4.0000 mg | ORAL_TABLET | Freq: Four times a day (QID) | ORAL | Status: DC | PRN

## 2023-05-20 MED ORDER — ONDANSETRON HCL 4 MG/2ML IJ SOLN: 4.0000 mg | Freq: Four times a day (QID) | INTRAMUSCULAR | Status: DC | PRN

## 2023-05-20 MED ORDER — ONDANSETRON HCL 4 MG/2ML IJ SOLN
4.0000 mg | Freq: Once | INTRAMUSCULAR | Status: AC
  Administered 2023-05-20: 4 mg via INTRAVENOUS
  Filled 2023-05-20: qty 2

## 2023-05-20 MED ORDER — HYDRALAZINE HCL 20 MG/ML IJ SOLN: 5.0000 mg | Freq: Three times a day (TID) | INTRAMUSCULAR | Status: DC | PRN

## 2023-05-20 NOTE — Assessment & Plan Note (Signed)
Patient has history of chronic Foley catheter, with recent transition to clean intermittent catheterization, and is complicated by BPH causing urinary retention UTI was present on admission with moderate leukocytes Urine culture is in process Continue with ceftriaxone 2 g IV daily, to complete 7-day course as ordered by EDP

## 2023-05-20 NOTE — ED Triage Notes (Signed)
Pt to ED for dysuria for a couple days. Pt states has not been eating the past couple days because he hasn't been able to care for self. Also c/o lower abd pain

## 2023-05-20 NOTE — Assessment & Plan Note (Addendum)
Baseline serum creatinine is 1.16-1.26/eGFR of 58 to > 60 Presumed multifactorial in setting of urinary infection and prerenal from poor p.o. intake Status post sodium chloride 2 L bolus per EDP Will continue with sodium chloride 150 mL/h, 1 day ordered Recheck BMP in the a.m.

## 2023-05-20 NOTE — ED Provider Notes (Signed)
[facility] Provider Note    Event Date/Time   First MD Initiated Contact with Patient 05/20/23 [phone]     (approximate)   History   Dysuria and Abdominal Pain   HPI  Wesley Wilson is a 82 y.o. male with history of HTN, T2DM, BPH with recent transition from Foley catheter to CIC, COPD presenting to the emergency department for evaluation of abdominal pain and weakness.  Multiple family members who typically care for him have been sick so he has had limited help at home with poor p.o. intake and decreased appetite.  No vomiting or diarrhea.  Does report lower abdominal pain, worse yesterday, improved but somewhat persistent today.  No fevers or chills.  No cough, cold, congestion.  I did review his outpatient neurology note from 05/11/2023.  At that time, his Foley catheter was removed and he was started on CIC.     Physical Exam   Triage Vital Signs: ED Triage Vitals  Encounter Vitals Group     BP 05/20/23 0807 93/83     Systolic BP Percentile --      Diastolic BP Percentile --      Pulse Rate 05/20/23 0805 (!) 109     Resp 05/20/23 0803 18     Temp 05/20/23 0803 98.2 F (36.8 C)     Temp src --      SpO2 05/20/23 0805 92 %     Weight 05/20/23 0805 211 lb 10.3 oz (96 kg)     Height 05/20/23 0805 6\' 3"  (1.905 m)     Head Circumference --      Peak Flow --      Pain Score 05/20/23 0804 3     Pain Loc --      Pain Education --      Exclude from Growth Chart --     Most recent vital signs: Vitals:   05/20/23 0930 05/20/23 1100  BP: 104/63 103/60  Pulse: (!) 103 (!) 102  Resp:    Temp:    SpO2: 91% 97%     General: Awake, interactive  CV:  Tachycardic with regular rhythm, normal peripheral perfusion Resp:  Lungs clear, unlabored respirations.  Abd:  Soft, nondistended, mild tenderness palpation over the lower abdomen, range of abdomen nontender Neuro:  Symmetric facial movement, fluid speech   ED Results / Procedures / Treatments    Labs (all labs ordered are listed, but only abnormal results are displayed) Labs Reviewed  CBC WITH DIFFERENTIAL/PLATELET - Abnormal; Notable for the following components:      Result Value   RBC 4.15 (*)    Hemoglobin 12.1 (*)    HCT 37.1 (*)    Neutro Abs 8.4 (*)    Lymphs Abs 0.4 (*)    All other components within normal limits  COMPREHENSIVE METABOLIC PANEL - Abnormal; Notable for the following components:   Sodium 130 (*)    CO2 21 (*)    Glucose, Bld 301 (*)    BUN 42 (*)    Creatinine, Ser 2.34 (*)    Calcium 8.1 (*)    Albumin 2.7 (*)    AST 77 (*)    ALT 82 (*)    Alkaline Phosphatase 135 (*)    Total Bilirubin 1.5 (*)    GFR, Estimated 27 (*)    All other components within normal limits  LIPASE, BLOOD - Abnormal; Notable for the following components:   Lipase 279 (*)    All  other components within normal limits  URINALYSIS, W/ REFLEX TO CULTURE (INFECTION SUSPECTED) - Abnormal; Notable for the following components:   Color, Urine YELLOW (*)    APPearance CLOUDY (*)    Glucose, UA >=500 (*)    Leukocytes,Ua MODERATE (*)    All other components within normal limits  URINE CULTURE  CULTURE, BLOOD (ROUTINE X 2)  CULTURE, BLOOD (ROUTINE X 2)  LACTIC ACID, PLASMA     EKG EKG independently reviewed interpreted by myself (ER attending) demonstrates:    RADIOLOGY Imaging independently reviewed and interpreted by myself demonstrates:  RUQ [place] without evidence of gallstones CT abdomen pelvis with air in the bladder lumen and bladder wall, did discuss this with radiology, air in bladder lumen can be related to catheterization, but air in bladder wall is more concerning for an emphysematous cystitis  PROCEDURES:  Critical Care performed: Yes, see critical care procedure note(s)  CRITICAL CARE Performed by: Trinna Post   Total critical care time: 32 minutes  Critical care time was exclusive of separately billable procedures and treating other  patients.  Critical care was necessary to treat or prevent imminent or life-threatening deterioration.  Critical care was time spent personally by me on the following activities: development of treatment plan with patient and/or surrogate as well as nursing, discussions with consultants, evaluation of patient's response to treatment, examination of patient, obtaining history from patient or surrogate, ordering and performing treatments and interventions, ordering and review of laboratory studies, ordering and review of radiographic studies, pulse oximetry and re-evaluation of patient's condition.   Procedures   MEDICATIONS ORDERED IN ED: Medications  cefTRIAXone (ROCEPHIN) 2 g in sodium chloride 0.9 % 100 mL IVPB (0 g Intravenous Stopped 05/20/23 1200)  sodium chloride 0.9 % bolus 1,000 mL (0 mLs Intravenous Stopped 05/20/23 1102)  ondansetron (ZOFRAN) injection 4 mg (4 mg Intravenous Given 05/20/23 0853)  chlordiazePOXIDE (LIBRIUM) capsule 10 mg (10 mg Oral Given 05/20/23 1144)  sodium chloride 0.9 % bolus 1,000 mL (1,000 mLs Intravenous New Bag/Given 05/20/23 1216)     IMPRESSION / MDM / ASSESSMENT AND PLAN / ED COURSE  I reviewed the triage vital signs and the nursing notes.  Differential diagnosis includes, but is not limited to, urinary retention, UTI, pyelonephritis, diverticulitis, other acute intra-abdominal process  Patient's presentation is most consistent with acute presentation with potential threat to life or bodily function.  82 year old male presenting to the emergency department for evaluation of abdominal pain and weakness.  Labs obtained demonstrating stable anemia.  CMP notable for hyperglycemia without DKA, but significant AKI with a creatinine of 2.34.  New transaminitis also noted with slightly elevated bilirubin.  CT ordered, will add on right upper quadrant ultrasound.  Lipase elevated at 279 concerning for pancreatitis though patient's pain is lower in his abdomen.  Urine  concerning for infection with moderate leukocyte esterase and greater than 50 white blood cells.  Pending imaging, but ultimately anticipate admission  Right upper quadrant ultrasound without significant derangement.  CT abdomen pelvis does demonstrate air in the bladder lumen and wall concerning for emphysematous cystitis.  Tachycardic on presentation here, but no other SIRS criteria currently.  Initially ordered cefepime for antibiotics given recent catheter, but based on urine cultures, pharmacy prior culture data, pharmacy team did feel that Rocephin was a reasonable empiric choice which was ordered.  Patient was updated on the results of his workup.  Discussed admission for further evaluation.  Patient comfortable this plan.  Will reach out to hospitalist team.  Case discussed with hospitalist team.  They will evaluate the patient for anticipated admission.      FINAL CLINICAL IMPRESSION(S) / ED DIAGNOSES   Final diagnoses:  Emphysematous cystitis  Elevated lipase  AKI (acute kidney injury) (HCC)     Rx / DC Orders   ED Discharge Orders     None        Note:  This document was prepared using Dragon voice recognition software and may include unintentional dictation errors.   Trinna Post, MD 05/20/23 1539

## 2023-05-20 NOTE — Assessment & Plan Note (Addendum)
Does not appear to be in acute exacerbation Maintenance inhaler equivalent, Dulera, twice daily resumed, Incruse Ellipta 1 puff daily resumed Albuterol nebulizer every 6 hours as needed for wheezing and shortness of breath resumed

## 2023-05-20 NOTE — Assessment & Plan Note (Signed)
Patient is currently borderline hypotensive/low normal tensive Home lisinopril 40 mg daily, metoprolol succinate 25 mg daily were not resumed on admission, a.m. team to resume when the benefits outweigh the risk Hydralazine 5 mg IV every 8 hours as needed for SBP getting 170, 4 days ordered

## 2023-05-20 NOTE — Assessment & Plan Note (Signed)
Home Librium 10 mg 3 times daily as needed resumed

## 2023-05-20 NOTE — Assessment & Plan Note (Signed)
Tamsulosin 0.4 mg daily with supper resumed Finasteride 5 mg daily resumed for 8/10

## 2023-05-20 NOTE — Assessment & Plan Note (Signed)
PT, OT

## 2023-05-20 NOTE — ED Notes (Signed)
Light green top, purple top, and urine sent to lab.

## 2023-05-20 NOTE — ED Notes (Signed)
Pt in CT.

## 2023-05-20 NOTE — Assessment & Plan Note (Addendum)
Home Jardiance 25 mg daily, Trulicity subcutaneous once a week injection, were not resumed on admission; I discussed this with the patient at bedside and he is okay with this Insulin SSI with at bedtime coverage ordered

## 2023-05-20 NOTE — H&P (Signed)
History and Physical   Wesley Wilson:[id number] DOB: 1940/10/21 DOA: 05/20/2023  PCP: Dortha Kern, MD  Patient coming from: Home  I have personally briefly reviewed patient's old medical records in [facility] EMR.  Chief Concern: Dysuria, abdominal pain  HPI: Wesley Wilson is a 82 year old male with history of BPH, with history of Foley catheter to recent transition to clean intermittent catheterization, aortic atherosclerosis, colonic diverticulosis, hypertension, depression, anxiety, non-insulin-dependent diabetes mellitus, who presents to the emergency department for chief concerns of dysuria and abdominal pain for 2 days.  Vitals in the ED showed temperature of 98, respiration rate of 18, heart rate of 109, blood pressure 93/83, improved to 99/62, SpO2 of 100% on room air.  Serum sodium is 130, potassium 5.0, chloride 98, bicarb 21, BUN of 42, serum creatinine of 2.34, nonfasting blood glucose 301, WBC 9.2, hemoglobin 12.1, platelets of 222.  Alk phos was elevated 135, elevated AST and ALT, 77 and 82 respectively, T. bili was elevated at 1.5.  Lactic acid was 1.4.  UA with positive for moderate leukocytes.  Blood cultures and urine culture have been collected and are in process.  CT abdomen and pelvis wo contrast: Small moderate amount of air within the nondependent portion of the urinary bladder lumen.  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  ED treatment: Ondansetron, Librium 10 mg p.o. one-time dose, ceftriaxone 2 g IV daily, 7 days ordered, sodium chloride 1 L bolus x 2 were given in the ED prior to inpatient admission. ---------------------------------- At bedside, he is able to me his name, age, current year, and current location.  He reports he is feeling fine only a little irritated because no one has come to help that incline of his hospital bed.  After I helped  declined his head of bed, he states he feels better.  He reports he started having dysuria, and difficulty emptying his bladder about two days ago.  She reports he just had a Foley catheter and he states that he does not want to have anymore Foley catheters.  He denies fever, hematuria, chest pain, shortness of breath, fever, cough, diarrhea, abdominal pain, swelling of his lower extremities.  He does have shortness of breath with exertion, and this has been going on for two days.   He endorses generalized poor appetite.  Social history: He lives at home with his wife. Patients sister is a Charity fundraiser and lives down the [place] from patient. His sister has been helping care for patient and his wife, however, patient's sister recently sprained her leg on 8/7. He is retired and formerly was a Medical illustrator beans, turnips, corn, and tobacco.   ROS: Constitutional: no weight change, no fever ENT/Mouth: no sore throat, no rhinorrhea Eyes: no eye pain, no vision changes Cardiovascular: no chest pain, + dyspnea,  no edema, no palpitations Respiratory: no cough, no sputum, no wheezing Gastrointestinal: no nausea, no vomiting, no diarrhea, no constipation Genitourinary: no urinary incontinence, + dysuria, no hematuria Musculoskeletal: no arthralgias, no myalgias Skin: no skin lesions, no pruritus, Neuro: + weakness, no loss of consciousness, no syncope Psych: no anxiety, no depression, + decrease appetite Heme/Lymph: no bruising, no bleeding  ED Course: Discussed with emergency medicine provider, patient requiring hospitalization for chief concerns of acute kidney injury and urinary tract infection.  Assessment/Plan  Principal Problem:   AKI (acute kidney injury) (HCC) Active Problems:   Acute  hypoxemic respiratory failure (HCC)   Urinary retention   UTI (urinary tract infection)   Hypertension   History of cardiac pacemaker   History of ETOH abuse   Type 2 diabetes mellitus, without long-term  current use of insulin (HCC)   BPH (benign prostatic hyperplasia)   Chronic obstructive pulmonary disease (COPD) (HCC)   Generalized weakness   Assessment and Plan:  * AKI (acute kidney injury) (HCC) Baseline serum creatinine is 1.16-1.26/eGFR of 58 to > 60 Presumed multifactorial in setting of urinary infection and prerenal from poor p.o. intake Status post sodium chloride 2 L bolus per EDP Will continue with sodium chloride 150 mL/h, 1 day ordered Recheck BMP in the a.m.  UTI (urinary tract infection) Patient has history of chronic Foley catheter, with recent transition to clean intermittent catheterization, and is complicated by BPH causing urinary retention UTI was present on admission with moderate leukocytes Urine culture is in process Continue with ceftriaxone 2 g IV daily, to complete 7-day course as ordered by EDP  Urinary retention Likely secondary to BPH complicated by urinary tract infection  Acute hypoxemic respiratory failure (HCC) Per ED mentation, patient SpO2 desatted to 88% on room air while sleeping Query undiagnosed obstructive sleep apnea  Continue 2 L oxygen supplementation, to maintain SpO2 greater than 92%  Hypertension Patient is currently borderline hypotensive/low normal tensive Home lisinopril 40 mg daily, metoprolol succinate 25 mg daily were not resumed on admission, a.m. team to resume when the benefits outweigh the risk Hydralazine 5 mg IV every 8 hours as needed for SBP getting 170, 4 days ordered  Generalized weakness PT, OT  Chronic obstructive pulmonary disease (COPD) (HCC) Does not appear to be in acute exacerbation Maintenance inhaler equivalent, Dulera, twice daily resumed, Incruse Ellipta 1 puff daily resumed Albuterol nebulizer every 6 hours as needed for wheezing and shortness of breath resumed  BPH (benign prostatic hyperplasia) Tamsulosin 0.4 mg daily with supper resumed Finasteride 5 mg daily resumed for 8/10  Type 2 diabetes  mellitus, without long-term current use of insulin (HCC) Home Jardiance 25 mg daily, Trulicity subcutaneous once a week injection, were not resumed on admission; I discussed this with the patient at bedside and he is okay with this Insulin SSI with at bedtime coverage ordered  History of ETOH abuse Home Librium 10 mg 3 times daily as needed resumed  Chart reviewed.   DVT prophylaxis: Heparin 5000 units subcutaneous every 8 hours Code Status: full code Diet: Heart healthy/carb modified Family Communication: a phone call was offered and patient declined. Disposition Plan: pending clinical course Consults called: none at this time Admission status: telemetry medical, inp  Past Medical History:  Diagnosis Date   Diabetes mellitus without complication (HCC)    Hypertension    Past Surgical History:  Procedure Laterality Date   CARDIAC PACEMAKER PLACEMENT     Social History:  reports that he quit smoking about 58 years ago. His smoking use included cigarettes. He started smoking about 59 years ago. He has a 0.7 pack-year smoking history. He has been exposed to tobacco smoke. He has never used smokeless tobacco. He reports that he does not drink alcohol and does not use drugs.  No Known Allergies Family History  Problem Relation Age of Onset   Hypertension Brother    Prostate cancer Brother    Family history: Family history reviewed and not pertinent.  Prior to Admission medications   Medication Sig Start Date End Date Taking? Authorizing Provider  acetaminophen (TYLENOL) 500 MG  tablet Take by mouth.   Yes [provider]  albuterol (PROVENTIL HFA;VENTOLIN HFA) 108 (90 Base) MCG/ACT inhaler Inhale 2 puffs into the lungs every 6 (six) hours as needed for wheezing or shortness of breath. 11/05/18  Yes Don Perking, [place], MD  atorvastatin (LIPITOR) 20 MG tablet Take 20 mg by mouth daily. 02/02/16  Yes [provider]  chlordiazePOXIDE (LIBRIUM) 10 MG capsule Take 10 mg  by mouth 3 (three) times daily as needed for anxiety. 12/04/18  Yes [provider]  diclofenac Sodium (VOLTAREN) 1 % GEL Apply 2 g topically 4 (four) times daily.   Yes [provider]  empagliflozin (JARDIANCE) 25 MG TABS tablet Take 25 mg by mouth daily.   Yes [provider]  finasteride (PROSCAR) 5 MG tablet Take 1 tablet by mouth daily. 11/10/20  Yes [provider]  fluticasone-salmeterol (ADVAIR) 250-50 MCG/ACT AEPB Inhale 1 puff into the lungs in the morning and at bedtime. 04/29/23 04/28/24 Yes [provider]  lisinopril (ZESTRIL) 40 MG tablet Take 0.5 tablets (20 mg total) by mouth daily. 05/04/23  Yes Arnetha Courser, MD  metoprolol succinate (TOPROL-XL) 25 MG 24 hr tablet Take 1 tablet (25 mg total) by mouth daily. 05/04/23  Yes Arnetha Courser, MD  senna-docusate (SENOKOT-S) 8.6-50 MG tablet Take 2 tablets by mouth daily.   Yes [provider]  sertraline (ZOLOFT) 25 MG tablet Take 25 mg by mouth daily. 05/18/23  Yes [provider]  sulfamethoxazole-trimethoprim (BACTRIM DS) 800-160 MG tablet Take 1 tablet by mouth 2 (two) times daily. 05/16/23  Yes [provider]  tadalafil (CIALIS) 5 MG tablet Take 5 mg by mouth daily.   Yes [provider]  tamsulosin (FLOMAX) 0.4 MG CAPS capsule TAKE TWO CAPSULES BY MOUTH EVERY EVENING 12/04/18  Yes [provider]  TRULICITY 4.5 MG/0.5ML SOPN SMARTSIG:4.5 Milligram(s) SUB-Q Once a Week 03/15/23  Yes [provider]  umeclidinium bromide (INCRUSE ELLIPTA) 62.5 MCG/ACT AEPB Inhale 1 puff into the lungs daily. 04/29/23  Yes [provider]  TRULICITY 1.5 MG/0.5ML SOPN INJECT 0.5MLS SUBCUTANEOUSLY ONCE A WEEK IN ABDOMEN, THIGH OR UPPER ARM. ROTATING INJECTION SITES Patient not taking: Reported on 05/20/2023 11/20/18   [provider]   Physical Exam: Vitals:   05/20/23 0930 05/20/23 1100 05/20/23 1221 05/20/23 1421  BP: 104/63 103/60  (!) 104/57   Pulse: (!) 103 (!) 102  99  Resp:    20  Temp:   98 F (36.7 C) 97.7 F (36.5 C)  TempSrc:   Oral   SpO2: 91% 97%  93%  Weight:      Height:       Constitutional: appears age-appropriate, NAD, calm Eyes: PERRL, lids and conjunctivae normal ENMT: Mucous membranes are moist. Posterior pharynx clear of any exudate or lesions. Age-appropriate dentition. Hearing appropriate Neck: normal, supple, no masses, no thyromegaly Respiratory: clear to auscultation bilaterally, no wheezing, no crackles. Normal respiratory effort. No accessory muscle use.  Cardiovascular: Regular rate and rhythm, no murmurs / rubs / gallops. No extremity edema. 2+ pedal pulses. No carotid bruits.  Abdomen: Suprapubic tenderness, no masses palpated, no hepatosplenomegaly. Bowel sounds positive.  Musculoskeletal: no clubbing / cyanosis. No joint deformity upper and lower extremities. Good ROM, no contractures, no atrophy. Normal muscle tone.  Skin: no rashes, lesions, ulcers. No induration Neurologic: Sensation intact. Strength 5/5 in all 4.  Psychiatric: Normal judgment and insight. Alert and oriented x 3. Normal mood.   EKG: Not indicated at this time  Chest x-ray on Admission: Not indicated at this time  US Abdomen Limited RUQ (LIVER/GB)  Result Date: 05/20/2023 CLINICAL DATA:  Transaminitis EXAM: ULTRASOUND ABDOMEN LIMITED RIGHT UPPER QUADRANT COMPARISON:  Same day CT abdomen/pelvis FINDINGS: Gallbladder: Contracted, likely accounting for the mild wall thickening. Is no shadowing stones or pericholecystic fluid. Sonographic Murphy's sign was reported negative. Common bile duct: Diameter: 8 mm, within normal limits for age Liver: No focal lesion identified. Within normal limits in parenchymal echogenicity. Portal vein is patent on color Doppler imaging with normal direction of blood flow towards the liver. Other: None. IMPRESSION: Normal right upper quadrant ultrasound. Electronically Signed   By: Lesia Hausen M.D.    On: 05/20/2023 11:45   CT ABDOMEN PELVIS WO CONTRAST  Result Date: 05/20/2023 CLINICAL DATA:  Abdominal pain, dysuria EXAM: CT ABDOMEN AND PELVIS WITHOUT CONTRAST TECHNIQUE: Multidetector CT imaging of the abdomen and pelvis was performed following the standard protocol without IV contrast. RADIATION DOSE REDUCTION: This exam was performed according to the departmental dose-optimization program which includes automated exposure control, adjustment of the mA and/or kV according to patient size and/or use of iterative reconstruction technique. COMPARISON:  None Available. FINDINGS: Lower chest: Emphysematous lung changes with bibasilar atelectasis. Hepatobiliary: Unremarkable unenhanced appearance of the liver. No focal liver lesion identified. Gallbladder within normal limits. No hyperdense gallstone. No biliary dilatation. Pancreas: Unremarkable. No pancreatic ductal dilatation or surrounding inflammatory changes. Spleen: Normal in size without focal abnormality. Adrenals/Urinary Tract: Unremarkable adrenal glands. Simple left renal cysts, which do not require follow-up imaging. Kidneys appear otherwise unremarkable. No renal stone or hydronephrosis. Small-moderate amount of air within the nondependent portion of the urinary bladder lumen. There are also several foci of air which appear to be located within the wall of the urinary bladder. Multiple bladder wall diverticula are present, largest arising from the right posterolateral aspect of the bladder. Stomach/Bowel: Stomach is within normal limits. Appendix appears normal. Extensive colonic diverticulosis. No evidence of bowel wall thickening, distention, or inflammatory changes. Vascular/Lymphatic: Aortic atherosclerosis. No enlarged abdominal or pelvic lymph nodes. Reproductive: Prostate is unremarkable. Other: No free fluid. No abdominopelvic fluid collection. No pneumoperitoneum. Musculoskeletal: No acute or significant osseous findings. Advanced multilevel  lumbar spondylosis. Probable intraosseous hemangioma in the L4 vertebral body. IMPRESSION: 1. Small-moderate amount of air within the nondependent portion of the urinary bladder lumen. There are also several foci of air which appear to be located within the wall of the urinary bladder. Findings are concerning for emphysematous cystitis. 2. Extensive colonic diverticulosis without evidence of acute diverticulitis. 3. Aortic Atherosclerosis (ICD10-I70.0) and Emphysema (ICD10-J43.9). Electronically Signed   By: Duanne Guess D.O.   On: 05/20/2023 10:03    Labs on Admission: I have personally reviewed following labs  CBC: Recent Labs  Lab 05/20/23 0831  WBC 9.4  NEUTROABS 8.4*  HGB 12.1*  HCT 37.1*  MCV 89.4  PLT 222   Basic Metabolic Panel: Recent Labs  Lab 05/20/23 0831  NA 130*  K 5.0  CL 98  CO2 21*  GLUCOSE 301*  BUN 42*  CREATININE 2.34*  CALCIUM 8.1*   GFR: Estimated Creatinine Clearance: 30.1 mL/min (A) (by C-G formula based on SCr of 2.34 mg/dL (H)).  Liver Function Tests: Recent Labs  Lab 05/20/23 0831  AST 77*  ALT 82*  ALKPHOS 135*  BILITOT 1.5*  PROT 6.7  ALBUMIN 2.7*   Recent Labs  Lab 05/20/23 0831  LIPASE 279*   Urine analysis:    Component Value Date/Time  COLORURINE YELLOW (A) 05/20/2023 0831   APPEARANCEUR CLOUDY (A) 05/20/2023 0831   APPEARANCEUR Clear 11/04/2014 1224   LABSPEC 1.027 05/20/2023 0831   LABSPEC 1.018 11/04/2014 1224   PHURINE 5.0 05/20/2023 0831   GLUCOSEU >=500 (A) 05/20/2023 0831   GLUCOSEU Negative 11/04/2014 1224   HGBUR NEGATIVE 05/20/2023 0831   BILIRUBINUR NEGATIVE 05/20/2023 0831   BILIRUBINUR Negative 11/04/2014 1224   KETONESUR NEGATIVE 05/20/2023 0831   PROTEINUR NEGATIVE 05/20/2023 0831   NITRITE NEGATIVE 05/20/2023 0831   LEUKOCYTESUR MODERATE (A) 05/20/2023 0831   LEUKOCYTESUR Negative 11/04/2014 1224   This document was prepared using Dragon Voice Recognition software and may include unintentional  dictation errors.  Dr. Sedalia Muta [facility]  If 7PM-7AM, please contact overnight-coverage provider If 7AM-7PM, please contact day attending provider [email or web address]  05/20/2023, 5:22 PM

## 2023-05-20 NOTE — Plan of Care (Signed)

## 2023-05-20 NOTE — Assessment & Plan Note (Signed)
Likely secondary to BPH complicated by urinary tract infection

## 2023-05-20 NOTE — Hospital Course (Signed)
Mr. Wesley Wilson is a 82 year old male with history of BPH, with history of Foley catheter to recent transition to clean intermittent catheterization, aortic atherosclerosis, colonic diverticulosis, hypertension, depression, anxiety, non-insulin-dependent diabetes mellitus, who presents to the emergency department for chief concerns of dysuria and abdominal pain for 2 days.  Vitals in the ED showed temperature of 98, respiration rate of 18, heart rate of 109, blood pressure 93/83, improved to 99/62, SpO2 of 100% on room air.  Serum sodium is 130, potassium 5.0, chloride 98, bicarb 21, BUN of 42, serum creatinine of 2.34, nonfasting blood glucose 301, WBC 9.2, hemoglobin 12.1, platelets of 222.  Alk phos was elevated 135, elevated AST and ALT, 77 and 82 respectively, T. bili was elevated at 1.5.  Lactic acid was 1.4.  UA with positive for moderate leukocytes.  Blood cultures and urine culture have been collected and are in process.  CT abdomen and pelvis wo contrast: Small moderate amount of air within the nondependent portion of the urinary bladder lumen.  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  ED treatment: Ondansetron, Librium 10 mg p.o. one-time dose, ceftriaxone 2 g IV daily, 7 days ordered, sodium chloride 1 L bolus x 2 were given in the ED prior to inpatient admission.

## 2023-05-20 NOTE — Assessment & Plan Note (Signed)
Per ED mentation, patient SpO2 desatted to 88% on room air while sleeping Query undiagnosed obstructive sleep apnea  Continue 2 L oxygen supplementation, to maintain SpO2 greater than 92%

## 2023-05-20 NOTE — ED Notes (Signed)
Pt placed on 2L [place] while sleeping due to O2 sats dropping to 88 on room air. Pt stated he has O2 he wears at home while he sleeps.

## 2023-05-21 LAB — GLUCOSE, CAPILLARY
Glucose-Capillary: 171 mg/dL — ABNORMAL HIGH (ref 70–99)
Glucose-Capillary: 206 mg/dL — ABNORMAL HIGH (ref 70–99)
Glucose-Capillary: 159 mg/dL — ABNORMAL HIGH (ref 70–99)
Glucose-Capillary: 157 mg/dL — ABNORMAL HIGH (ref 70–99)

## 2023-05-21 MED ORDER — ALUM & MAG HYDROXIDE-SIMETH 200-200-20 MG/5ML PO SUSP
30.0000 mL | Freq: Four times a day (QID) | ORAL | Status: DC | PRN
  Administered 2023-05-21 (×2): 30 mL via ORAL
  Filled 2023-05-21 (×2): qty 30

## 2023-05-21 MED ORDER — TRAZODONE HCL 50 MG PO TABS
50.0000 mg | ORAL_TABLET | Freq: Every day | ORAL | Status: DC
  Administered 2023-05-21 – 2023-05-22 (×2): 50 mg via ORAL
  Filled 2023-05-21 (×2): qty 1

## 2023-05-21 NOTE — Plan of Care (Signed)

## 2023-05-21 NOTE — Evaluation (Signed)
Physical Therapy Evaluation Patient Details Name: Wesley Wilson MRN: [id number] DOB: May 22, 1941 Today's Date: 05/21/2023  History of Present Illness  Pt is an 82 year old male presenting to th eED with dysuria and abdominal pain for 2 daysadmitted with AKI, UTI acute hypoxemic respiratory failure;     PMH significant for BPH, with history of Foley catheter to recent transition to clean intermittent catheterization, aortic atherosclerosis, colonic diverticulosis, hypertension, depression, anxiety, non-insulin-dependent diabetes mellitus  Clinical Impression  82 yo Male diagnosed with acute kidney injury/UTI; When PT entered room, patient reports feeling very hot and nauseated. PT assisted patient in removing t-shirt that was under his gown and adjusted room temperature. Patient states, "I just don't feel right, I'm sweaty, why would they leave this shirt on me?" PT assessed vitals, BP: 101/66, HR 99, Spo2 initially 80% on room air. With cues for deep breathing Spo2 improved to 88%. PT asked patient if his oxygen has been low before. He states, "yeah, I am on O2 at night time. We can put it on if needed." PT assisted patient in donning supplemental O2 (3L nasal cannula). SPo2 quickly recovered to 93%. PT reached out to RN to inform of vitals. Patient was able to scoot in bed and transition supine<>sitting without physical assistance. He refused standing up and walking due to increased fatigue. Patient lives at home with his wife and helps her as she has limited mobility from previous stroke. The patient's sister lives down the street and was helping the family however she recently sprained her leg. Unsure how much assistance the patient has for ADLs. He would benefit from additional skilled PT intervention to improve mobility and safety.       If plan is discharge home, recommend the following: A little help with walking and/or transfers;Assistance with cooking/housework;Assist for transportation;Help with  stairs or ramp for entrance;A little help with bathing/dressing/bathroom   Can travel by private vehicle   No    Equipment Recommendations None recommended by PT  Recommendations for Other Services       Functional Status Assessment Patient has had a recent decline in their functional status and demonstrates the ability to make significant improvements in function in a reasonable and predictable amount of time.     Precautions / Restrictions Precautions Precautions: Fall Restrictions Weight Bearing Restrictions: No      Mobility  Bed Mobility Overal bed mobility: Modified Independent Bed Mobility: Supine to Sit, Sit to Supine     Supine to sit: Modified independent (Device/Increase time), HOB elevated Sit to supine: Modified independent (Device/Increase time), HOB elevated   General bed mobility comments: able to scoot up in bed and reposition self without physical assistance. Patient Response:  (pt reports feeling hot, nauseated and just not feeling well)  Transfers                   General transfer comment: pt refused getting up out of bed stating, "I am just too tired."    Ambulation/Gait               General Gait Details: unable to asses as patient refused  Stairs            Wheelchair Mobility     Tilt Bed Tilt Bed Patient Response:  (pt reports feeling hot, nauseated and just not feeling well)  Modified Rankin (Stroke Patients Only)       Balance Overall balance assessment: Needs assistance Sitting-balance support: No upper extremity supported, Feet supported Sitting  balance-Leahy Scale: Good Sitting balance - Comments: able to remove t-shirt without instability       Standing balance comment: refused to stand due to fatigue; not formally assessed;                             Pertinent Vitals/Pain Pain Assessment Pain Assessment: No/denies pain    Home Living Family/patient expects to be discharged to::  Private residence Living Arrangements: Spouse/significant other (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 however she just sprained her leg a few days ago) Type of Home: House Home Access: Ramped entrance       Home Layout: Multi-level;Able to live on main level with bedroom/bathroom Home Equipment: Rolling Walker (2 wheels);Rollator (4 wheels);BSC/3in1;Grab bars - toilet;Shower seat      Prior Function Prior Level of Function : Driving             Mobility Comments: would use RW prn; denies any recent falls ADLs Comments: modI to independent     Extremity/Trunk Assessment   Upper Extremity Assessment Upper Extremity Assessment: Overall WFL for tasks assessed    Lower Extremity Assessment Lower Extremity Assessment: Overall WFL for tasks assessed;Generalized weakness    Cervical / Trunk Assessment Cervical / Trunk Assessment: Kyphotic  Communication   Communication Communication: No apparent difficulties Cueing Techniques: Verbal cues  Cognition Arousal: Alert Behavior During Therapy: WFL for tasks assessed/performed Overall Cognitive Status: Within Functional Limits for tasks assessed                                 General Comments: oriented x4        General Comments General comments (skin integrity, edema, etc.): no edema noted;    Exercises     Assessment/Plan    PT Assessment Patient needs continued PT services  PT Problem List Cardiopulmonary status limiting activity;Decreased activity tolerance;Decreased balance;Decreased mobility       PT Treatment Interventions Gait training;Stair training;Functional mobility training;Therapeutic activities;Therapeutic exercise;Balance training;Neuromuscular re-education;DME instruction;Patient/family education    PT Goals (Current goals can be found in the  Care Plan section)  Acute Rehab PT Goals Patient Stated Goal: get better PT Goal Formulation: With patient Time For Goal Achievement: 06/04/23 Potential to Achieve Goals: Fair    Frequency Min 1X/week     Co-evaluation               AM-PAC PT "6 Clicks" Mobility  Outcome Measure Help needed turning from your back to your side while in a flat bed without using bedrails?: None Help needed moving from lying on your back to sitting on the side of a flat bed without using bedrails?: None Help needed moving to and from a bed to a chair (including a wheelchair)?: A Little Help needed standing up from a chair using your arms (e.g., wheelchair or bedside chair)?: A Little Help needed to walk in hospital room?: A Little Help needed climbing 3-5 steps with a railing? : A Lot 6 Click Score: 19    End of Session Equipment Utilized During Treatment: Gait belt;Oxygen (3L O2 re-applied during session) Activity Tolerance: Patient limited by fatigue Patient left: in bed;with call bell/phone within reach;with bed alarm set Nurse Communication: Mobility status;Other (comment) (spoke with RN regarding vitals and  patient feeling hot/nauseated) PT Visit Diagnosis: Other abnormalities of gait and mobility (R26.89);Difficulty in walking, not elsewhere classified (R26.2)    Time: 1042-1100 PT Time Calculation (min) (ACUTE ONLY): 18 min   Charges:   PT Evaluation $PT Eval Low Complexity: 1 Low   PT General Charges $$ ACUTE PT VISIT: 1 Visit          Kaisa Wofford PT, DPT 05/21/2023, 3:06 PM

## 2023-05-21 NOTE — Progress Notes (Signed)
Mobility Specialist - Progress Note     05/21/23 0913  Mobility  Activity Ambulated with assistance in room  Level of Assistance Standby assist, set-up cues, supervision of patient - no hands on  Assistive Device Front wheel walker  Distance Ambulated (ft) 20 ft  Range of Motion/Exercises Active  Activity Response Tolerated well  Mobility Referral Yes  $Mobility charge 1 Mobility  Mobility Specialist Start Time (ACUTE ONLY) 0900  Mobility Specialist Stop Time (ACUTE ONLY) 0915  Mobility Specialist Time Calculation (min) (ACUTE ONLY) 15 min   Pt resting in recliner on RA upon entry. Pt STS and ambulates to door in room SBA with RW. Pt endorses no pain. RN notified of trouble sleeping. Pt returned to bed and left with needs in reach.   Johnathan Hausen Mobility Specialist 05/21/23, 9:35 AM

## 2023-05-21 NOTE — Evaluation (Addendum)
Occupational Therapy Evaluation Patient Details Name: Wesley Wilson MRN: [id number] DOB: 1941-01-13 Today's Date: 05/21/2023   History of Present Illness Pt is an 82 year old male presenting to th eED with dysuria and abdominal pain for 2 daysadmitted with AKI, UTI acute hypoxemic respiratory failure;     PMH significant for BPH, with history of Foley catheter to recent transition to clean intermittent catheterization, aortic atherosclerosis, colonic diverticulosis, hypertension, depression, anxiety, non-insulin-dependent diabetes mellitus   Clinical Impression   Chart reviewd to date, pt greeted in bed agreeable to OT evaluation. Pt is alert and oriented x4. PTA Pt amb with RW, generally MOD I-I in ADL/IADL prior to illness approx 3 weeks ago. 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 Pt presents with deficits in strength, endurance, activity tolerance, balance all affecting safe and optimal ADL completion. Pt will benefit from skilled OT to address functional deficits and to facilitate return to PLOF. OT will follow acutely.       If plan is discharge home, recommend the following: A little help with walking and/or transfers;A little help with bathing/dressing/bathroom;Help with stairs or ramp for entrance;Assist for transportation;Assistance with cooking/housework    Functional Status Assessment  Patient has had a recent decline in their functional status and demonstrates the ability to make significant improvements in function in a reasonable and predictable amount of time.  Equipment Recommendations  None recommended by OT;Other (comment) (pt reports he has recommended equipment at home)    Recommendations for Other Services       Precautions / Restrictions Precautions Precautions: Fall Restrictions Weight Bearing Restrictions: No      Mobility Bed Mobility Overal bed mobility: Needs Assistance Bed Mobility:  Supine to Sit     Supine to sit: Modified independent (Device/Increase time), HOB elevated          Transfers Overall transfer level: Needs assistance   Transfers: Sit to/from Stand Sit to Stand: From elevated surface, Contact guard assist                  Balance Overall balance assessment: Needs assistance Sitting-balance support: No upper extremity supported, Feet supported Sitting balance-Leahy Scale: Good     Standing balance support: Bilateral upper extremity supported, Reliant on assistive device for balance, During functional activity Standing balance-Leahy Scale: Fair                             ADL either performed or assessed with clinical judgement   ADL Overall ADL's : Needs assistance/impaired Eating/Feeding: Sitting;Set up   Grooming: Wash/dry hands;Wash/dry face;Sitting;Set up               Lower Body Dressing: Moderate assistance Lower Body Dressing Details (indicate cue type and reason): anticipate Toilet Transfer: Contact guard assist;Minimal assistance;Rolling walker (2 wheels);Ambulation Toilet Transfer Details (indicate cue type and reason): simulated with intermittent vcs for safety/technique         Functional mobility during ADLs: Contact guard assist;Minimal assistance;Rolling walker (2 wheels) (approx 12' in room forward and backward with RW)       Vision Patient Visual Report: Blurring of vision (when trying to pick up items close to him, pt reports this has happened since illness started in July and has notified his providers)              Pertinent Vitals/Pain Pain Assessment Pain Assessment: No/denies  pain     Extremity/Trunk Assessment Upper Extremity Assessment Upper Extremity Assessment: Overall WFL for tasks assessed   Lower Extremity Assessment Lower Extremity Assessment: Generalized weakness;Overall [facility] for tasks assessed   Cervical / Trunk Assessment Cervical / Trunk Assessment: Kyphotic    Communication Communication Communication: No apparent difficulties Cueing Techniques: Verbal cues   Cognition Arousal: Alert Behavior During Therapy: WFL for tasks assessed/performed Overall Cognitive Status: No family/caregiver present to determine baseline cognitive functioning Area of Impairment: Safety/judgement                         Safety/Judgement: Decreased awareness of deficits           General Comments  spo2 to 88% on RA at rest, spo2 to 88% on 2 L via [place] with transfer to the chair and short bouts of ambulation in room, recovered to above 90% on 2 L via [place] within 30 seconds. HR in the 80s throughout    Exercises Other Exercises Other Exercises: edu re: role of OT, role of rehab, discharge recommendations, home safety, falls prevention, DME use        Home Living Family/patient expects to be discharged to:: Private residence Living Arrangements: Spouse/significant other Available Help at Discharge: Family;Available PRN/intermittently Type of Home: House Home Access: Ramped entrance     Home Layout: Multi-level;Able to live on main level with bedroom/bathroom Alternate Level Stairs-Number of Steps: two story farm house   Bathroom Shower/Tub: Producer, television/film/video: Standard Bathroom Accessibility: Yes   Home Equipment: Agricultural consultant (2 wheels);Rollator (4 wheels);BSC/3in1;Grab bars - toilet;Shower seat          Prior Functioning/Environment Prior Level of Function : Independent/Modified Independent;Driving;Working/employed             Mobility Comments: prior to approx one month ago pt amb community distances (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); since acute illnesses, amb in house with RW, assist to amb outside with RW ADLs Comments: generally MOD I in ADL/IADL prior to approx 1 month ago; 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        OT Problem List: Decreased activity tolerance;Decreased strength;Decreased  knowledge of use of DME or AE;Cardiopulmonary status limiting activity;Decreased knowledge of precautions;Decreased safety awareness;Impaired balance (sitting and/or standing)      OT Treatment/Interventions: Self-care/ADL training;Balance training;Therapeutic exercise;DME and/or AE instruction;Therapeutic activities;Patient/family education    OT Goals(Current goals can be found in the care plan section) Acute Rehab OT Goals Patient Stated Goal: go home OT Goal Formulation: With patient Time For Goal Achievement: 06/04/23 Potential to Achieve Goals: Fair ADL Goals Pt Will Perform Grooming: with modified independence Pt Will Perform Lower Body Dressing: with modified independence;sit to/from stand Pt Will Transfer to Toilet: with modified independence;ambulating Pt Will Perform Toileting - Clothing Manipulation and hygiene: with modified independence;sit to/from stand  OT Frequency: Min 1X/week    Co-evaluation              AM-PAC OT "6 Clicks" Daily Activity     Outcome Measure Help from another person eating meals?: None Help from another person taking care of personal grooming?: None Help from another person toileting, which includes using toliet, bedpan, or urinal?: A Little Help from another person bathing (including washing, rinsing, drying)?: A Little Help from another person to put on and taking off regular upper body clothing?: None Help from another person to put on and taking off regular lower body  clothing?: A Little 6 Click Score: 21   End of Session Equipment Utilized During Treatment: Gait belt;Rolling walker (2 wheels);Oxygen Nurse Communication: Mobility status (vital signs)  Activity Tolerance: Patient tolerated treatment well Patient left: in chair;with call bell/phone within reach;with chair alarm set  OT Visit Diagnosis: Unsteadiness on feet (R26.81);Muscle weakness (generalized) (M62.81);Other abnormalities of gait and mobility (R26.89)                 Time: 4098-1191 OT Time Calculation (min): 29 min Charges:  OT General Charges $OT Visit: 1 Visit OT Evaluation $OT Eval Moderate Complexity: 1 Mod  Oleta Mouse, OTD OTR/L  05/21/23, 3:15 PM

## 2023-05-21 NOTE — Progress Notes (Addendum)
Progress Note   [name] WUJ:[id number] DOB: Jul 31, 1941 DOA: 05/20/2023     1 DOS: the patient was seen and examined on 05/21/2023   Brief hospital course: Mr. Wesley Wilson is a 82 year old male with history of BPH, with history of Foley catheter to recent transition to clean intermittent catheterization, aortic atherosclerosis, colonic diverticulosis, hypertension, depression, anxiety, non-insulin-dependent diabetes mellitus admitted for AKI, UTI, Mild - moderate Hyponatremia  Assessment and Plan: * AKI (acute kidney injury) (HCC): Cr improving from 2.34--> 1.62 this AM (Baseline Cr is 1.16-1.26) Likely 2/2 UTI and prerenal from poor p.o. intake Reduce IVFs rate to 100cc/hr for another 20 hours Recheck BMP in the a.m.  Hyponatremia: Na at 130 Likely from poor PO intake Give Normal saline IVFs at 100cc/hr, f/u BMP in am  UTI (urinary tract infection) with history of chronic Foley catheter that was transitioned to clean intermittent catheterization, and is complicated by BPH causing urinary retention Abnormal UA at admission--> Urine culture is in process Blood Cx NGTD Continue with ceftriaxone 2 g IV daily, to complete 7-day course  Insomnia/?Anxiety: Give trazodone at bedtime for sleep  Urinary retention secondary to BPH complicated by urinary tract infection  Acute hypoxemic respiratory failure (HCC) Per ED mentation, patient SpO2 desatted to 88% on room air while sleeping Query undiagnosed obstructive sleep apnea  Continue 2 L oxygen supplementation, to maintain SpO2 greater than 92%  Hypertension: BP soft around 100-108 since admission Hold antihypertensives (on lisinopril 40 mg daily, metoprolol succinate 25 mg at home) Hydralazine 5 mg IV every 8 hours as needed for SBP getting 170, 4 days ordered  Generalized weakness: PT, OT consulted, f/u recommendations  Chronic obstructive pulmonary disease: not in acute exacerbation Maintenance inhaler equivalent,  Dulera, twice daily resumed, Incruse Ellipta 1 puff daily resumed Albuterol nebulizer every 6 hours as needed for wheezing and shortness of breath resumed  BPH (benign prostatic hyperplasia):  Continue Tamsulosin 0.4 mg daily & Finasteride 5 mg daily   Type 2 diabetes mellitus, without long-term current use of insulin (HCC): controlled  (Pt is on Jardiance 25 mg daily, Trulicity subcutaneous once a week injection at home)- resume at discharge  Continue Insulin SSI with at bedtime coverage ordered  History of ETOH abuse: continue Librium 10 mg 3 times daily as needed       Subjective: Reported feeling fatigue Mild lower abdominal pain Denies nausea/vomiting, diarrhea, dyspnea, headache, dizziness, chest pain, fever, chills  Physical Exam: Vitals:   05/20/23 1421 05/20/23 1958 05/21/23 0407 05/21/23 0826  BP: (!) 104/57 108/67 100/62 (!) 102/56  Pulse: 99 (!) 106 99 (!) 102  Resp: 20 18 18 17   Temp: 97.7 F (36.5 C) 98.1 F (36.7 C) 98.2 F (36.8 C) 97.7 F (36.5 C)  TempSrc:  Oral Oral Oral  SpO2: 93% 90% 92% 94%  Weight:      Height:      Physical Exam Constitutional:      Appearance: He is well-developed. He is ill-appearing.  HENT:     Head: Normocephalic and atraumatic.     Mouth/Throat:     Mouth: Mucous membranes are moist.  Eyes:     Extraocular Movements: Extraocular movements intact.     Pupils: Pupils are equal, round, and reactive to light.  Cardiovascular:     Rate and Rhythm: Normal rate and regular rhythm.     Heart sounds: No murmur heard. Pulmonary:     Effort: Pulmonary effort is normal.     Breath sounds:  Normal breath sounds.  Abdominal:     General: Abdomen is flat. Bowel sounds are normal. There is no distension. There are no signs of injury.     Palpations: Abdomen is soft.     Tenderness: There is abdominal tenderness in the suprapubic area.  Skin:    General: Skin is warm.     Capillary Refill: Capillary refill takes 2 to 3 seconds.   Neurological:     General: No focal deficit present.     Mental Status: He is alert and oriented to person, place, and time.     Cranial Nerves: No cranial nerve deficit.     Motor: No weakness.  Psychiatric:        Mood and Affect: Mood is anxious.        Behavior: Behavior normal.      Data Reviewed:  Latest Reference Range & Units 05/21/23 03:53  Sodium 135 - 145 mmol/L 130 (L)  Potassium 3.5 - 5.1 mmol/L 4.2  Chloride 98 - 111 mmol/L 102  CO2 22 - 32 mmol/L 20 (L)  Glucose 70 - 99 mg/dL 161 (H)  BUN 8 - 23 mg/dL 32 (H)  Creatinine 0.96 - 1.24 mg/dL 0.45 (H)  Calcium 8.9 - 10.3 mg/dL 7.7 (L)  Anion gap 5 - 15  8  GFR, Estimated >60 mL/min 43 (L)  WBC 4.0 - 10.5 K/uL 6.8  RBC 4.22 - 5.81 MIL/uL 3.56 (L)  Hemoglobin 13.0 - 17.0 g/dL 40.9 (L)  HCT 81.1 - 91.4 % 30.7 (L)  MCV 80.0 - 100.0 fL 86.2  MCH 26.0 - 34.0 pg 29.8  MCHC 30.0 - 36.0 g/dL 78.2  RDW 95.6 - 21.3 % 14.6  Platelets 150 - 400 K/uL 192  nRBC 0.0 - 0.2 % 0.0  (L): Data is abnormally low (H): Data is abnormally high  Family Communication: none  Disposition: Status is: Inpatient   Planned Discharge Destination: Home     Time spent: 35 minutes  Author: Ernestene Mention, MD 05/21/2023 2:18 PM  For on call review [email or web address].

## 2023-05-22 DIAGNOSIS — N179 Acute kidney failure, unspecified: Secondary | ICD-10-CM | POA: Diagnosis not present

## 2023-05-22 LAB — CBC
HCT: 29.9 % — ABNORMAL LOW (ref 39.0–52.0)
Hemoglobin: 10.1 g/dL — ABNORMAL LOW (ref 13.0–17.0)
MCH: 29.4 pg (ref 26.0–34.0)
MCHC: 33.8 g/dL (ref 30.0–36.0)
MCV: 86.9 fL (ref 80.0–100.0)
Platelets: 214 10*3/uL (ref 150–400)
RBC: 3.44 MIL/uL — ABNORMAL LOW (ref 4.22–5.81)
RDW: 14.6 % (ref 11.5–15.5)
WBC: 6.2 10*3/uL (ref 4.0–10.5)
nRBC: 0 % (ref 0.0–0.2)

## 2023-05-22 LAB — BASIC METABOLIC PANEL WITH GFR
Anion gap: 4 — ABNORMAL LOW (ref 5–15)
BUN: 21 mg/dL (ref 8–23)
CO2: 21 mmol/L — ABNORMAL LOW (ref 22–32)
Calcium: 7.7 mg/dL — ABNORMAL LOW (ref 8.9–10.3)
Chloride: 108 mmol/L (ref 98–111)
Creatinine, Ser: 1.07 mg/dL (ref 0.61–1.24)
GFR, Estimated: >60 mL/min (ref >60)
Glucose, Bld: 131 mg/dL — ABNORMAL HIGH (ref 70–99)
Potassium: 4.1 mmol/L (ref 3.5–5.1)
Sodium: 133 mmol/L — ABNORMAL LOW (ref 135–145)

## 2023-05-22 LAB — GLUCOSE, CAPILLARY
Glucose-Capillary: 147 mg/dL — ABNORMAL HIGH (ref 70–99)
Glucose-Capillary: 204 mg/dL — ABNORMAL HIGH (ref 70–99)
Glucose-Capillary: 167 mg/dL — ABNORMAL HIGH (ref 70–99)
Glucose-Capillary: 173 mg/dL — ABNORMAL HIGH (ref 70–99)

## 2023-05-22 MED ORDER — ATORVASTATIN CALCIUM 20 MG PO TABS
20.0000 mg | ORAL_TABLET | Freq: Every day | ORAL | Status: DC
  Administered 2023-05-22 – 2023-05-23 (×2): 20 mg via ORAL
  Filled 2023-05-22 (×2): qty 1

## 2023-05-22 MED ORDER — NYSTATIN 100000 UNIT/GM EX POWD
Freq: Three times a day (TID) | CUTANEOUS | Status: DC
  Filled 2023-05-22: qty 15

## 2023-05-22 MED ORDER — TADALAFIL 5 MG PO TABS
5.0000 mg | ORAL_TABLET | Freq: Every day | ORAL | Status: DC
  Administered 2023-05-22 – 2023-05-23 (×2): 5 mg via ORAL
  Filled 2023-05-22 (×2): qty 1

## 2023-05-22 MED ORDER — METOPROLOL SUCCINATE ER 25 MG PO TB24
25.0000 mg | ORAL_TABLET | Freq: Every day | ORAL | Status: DC
  Administered 2023-05-22 – 2023-05-23 (×2): 25 mg via ORAL
  Filled 2023-05-22 (×2): qty 1

## 2023-05-22 MED ORDER — GUAIFENESIN 100 MG/5ML PO LIQD: 10.0000 mL | ORAL | Status: DC | PRN

## 2023-05-22 MED ORDER — SENNOSIDES-DOCUSATE SODIUM 8.6-50 MG PO TABS
2.0000 | ORAL_TABLET | Freq: Every day | ORAL | Status: DC
  Administered 2023-05-22 – 2023-05-23 (×2): 2 via ORAL
  Filled 2023-05-22 (×2): qty 2

## 2023-05-22 MED ORDER — CEPHALEXIN 500 MG PO CAPS
500.0000 mg | ORAL_CAPSULE | Freq: Two times a day (BID) | ORAL | Status: DC
  Administered 2023-05-23: 500 mg via ORAL
  Filled 2023-05-22: qty 1

## 2023-05-22 NOTE — Plan of Care (Signed)

## 2023-05-22 NOTE — Progress Notes (Signed)
[facility]  - [place] at [facility]   PATIENT NAME: Wesley Wilson    MR#:  [id number]  DATE OF BIRTH:  1940/10/23  SUBJECTIVE:  no family at bedside. Patient overall feeling better. Able to urinate using urinal. Post void urine today 200 mL. Patient is not keen on getting Foley catheter back. Just cause a lot of discomfort. Follows with [facility] urology. Denies any abdominal pain.    VITALS:  Blood pressure 117/76, pulse 96, temperature 97.9 F (36.6 C), temperature source Oral, resp. rate 16, height 6\' 3"  (1.905 m), weight 96 kg, SpO2 99%.  PHYSICAL EXAMINATION:   GENERAL:  82 y.o.-year-old patient with no acute distress.  LUNGS: Normal breath sounds bilaterally, no wheezing CARDIOVASCULAR: S1, S2 normal. No murmur   ABDOMEN: Soft, nontender, nondistended. Bowel sounds present.  EXTREMITIES: No  edema b/l.    NEUROLOGIC: nonfocal  patient is alert and awake SKIN: No obvious rash, lesion, or ulcer.   LABORATORY PANEL:  CBC Recent Labs  Lab 05/22/23 0349  WBC 6.2  HGB 10.1*  HCT 29.9*  PLT 214    Chemistries  Recent Labs  Lab 05/20/23 0831 05/21/23 0353 05/22/23 0349  NA 130*   < > 133*  K 5.0   < > 4.1  CL 98   < > 108  CO2 21*   < > 21*  GLUCOSE 301*   < > 131*  BUN 42*   < > 21  CREATININE 2.34*   < > 1.07  CALCIUM 8.1*   < > 7.7*  AST 77*  --   --   ALT 82*  --   --   ALKPHOS 135*  --   --   BILITOT 1.5*  --   --    < > = values in this interval not displayed.   Assessment and Plan Wesley Wilson is a 82 year old male with history of BPH, with history of Foley catheter to recent transition to clean intermittent catheterization, aortic atherosclerosis, colonic diverticulosis, hypertension, depression, anxiety, non-insulin-dependent diabetes mellitus admitted for AKI, UTI, Mild - moderate Hyponatremia    AKI (acute kidney injury) (HCC): Cr improving from 2.34--> 1.62 --1.02 this AM (Baseline Cr is 1.16-1.26) --Likely 2/2 UTI and prerenal from  poor p.o. intake and urinary retention --d/c IVF.   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 Discussed with patient that his post void will residual urine is more than 300 mL I will have to place Foley catheter  -- consider urology consultation here if needed   Acute hypoxemic respiratory failure (HCC) Per ED mentation, patient SpO2 desatted to 88% on room air while sleeping Query undiagnosed obstructive sleep apnea  --sats 99% on RA   Hypertension: BP soft around 100-108 since admission Hold antihypertensives (on lisinopril 40 mg daily, metoprolol succinate 25 mg at home) Hydralazine 5 mg IV every 8 hours as needed for SBP getting 170, 4 days ordered   Generalized  weakness: PT, OT consulted, f/u recommendations   Chronic obstructive pulmonary disease: not in acute exacerbation Maintenance inhaler equivalent, Dulera, twice daily resumed, Incruse Ellipta 1 puff daily resumed Albuterol nebulizer every 6 hours as needed for wheezing and shortness of breath resumed   BPH (benign prostatic hyperplasia):  Continue Tamsulosin 0.4 mg daily & Finasteride 5 mg daily    Type 2 diabetes mellitus, without long-term current use of insulin (HCC): controlled  (Pt is on Jardiance 25 mg daily, Trulicity subcutaneous once a week injection at home)- resume at discharge  Continue  Insulin SSI with at bedtime coverage ordered   History of ETOH abuse: continue Librium 10 mg 3 times daily as needed   generalized weakness deconditioning. PT recommends rehab. Patient does not want to go to rehab. TOC for discharge planning discussed with patient regarding home health arrangement.   Procedures: Family communication :none Consults :none CODE STATUS: full DVT Prophylaxis :heparin Level of care: Telemetry Medical Status is: Inpatient Remains inpatient appropriate because: urinary retention, generalized weakness    TOTAL TIME TAKING CARE OF THIS PATIENT: 35 minutes.  >50% time spent on counselling and coordination of care  Note: This dictation was prepared with Dragon dictation along with smaller phrase technology. Any transcriptional errors that result from this process are unintentional.  Enedina Finner M.D    [facility]   CC: Primary care physician; Dortha Kern, MD

## 2023-05-22 NOTE — Progress Notes (Signed)
Mobility Specialist - Progress Note    05/22/23 1019  Mobility  Activity Ambulated with assistance in hallway  Level of Assistance Standby assist, set-up cues, supervision of patient - no hands on  Assistive Device Front wheel walker  Distance Ambulated (ft) 180 ft  Range of Motion/Exercises Active  Activity Response Tolerated well  Mobility Referral Yes  $Mobility charge 1 Mobility  Mobility Specialist Start Time (ACUTE ONLY) [id number]  Mobility Specialist Stop Time (ACUTE ONLY) 1000  Mobility Specialist Time Calculation (min) (ACUTE ONLY) 22 min   Pt resting in bed on RA upon entry. Pt STS MinA (pulled up on walker) and ambulates to hallway around NS for 1 lap SBA. Pt returned to bed and left with needs in reach, bed alarm activated. PRN O2 applied by pt.   Wesley Wilson Mobility Specialist 05/22/23, 10:25 AM

## 2023-05-22 NOTE — TOC Initial Note (Signed)
Transition of Care [facility]) - Initial/Assessment Note    Patient Details  Name: Wesley Wilson MRN: [id number] Date of Birth: [birth year]  [facility] CM/SW Contact:    Kemper Durie, RN Phone Number: 05/22/2023, 4:10 PM  Clinical Narrative:                  Patient admitted from home, previously active with [facility] for RN and PT. Confirmed with Elnita Maxwell.  Discussed recommendations for SNF for short term rehab, he declines.  Agrees to home health, Elnita Maxwell notified, will restart services once patient is discharged.   Expected Discharge Plan: Home w Home Health Services Barriers to Discharge: Continued Medical Work up   Patient Goals and CMS Choice Patient states their goals for this hospitalization and ongoing recovery are:: home with [facility]   Choice offered to / list presented to : Patient      Expected Discharge Plan and Services     Post Acute Care Choice: Home Health Living arrangements for the past 2 months: Single Family Home                           HH Arranged: RN, OT, PT, Social Work [employer] Agency: [employer] [facility] Services Date [facility] Agency Contacted: 05/22/23 Time HH Agency Contacted: [id number] Representative spoke with at [facility] Agency: Elnita Maxwell  Prior Living Arrangements/Services Living arrangements for the past 2 months: Single Family Home Lives with:: Spouse Patient language and need for interpreter reviewed:: Yes Do you feel safe going back to the place where you live?: Yes      Need for Family Participation in Patient Care: Yes (Comment) Care giver support system in place?: Yes (comment) Current home services: DME, Home PT, Home RN Criminal Activity/Legal Involvement Pertinent to Current Situation/Hospitalization: No - Comment as needed  Activities of Daily Living Home Assistive Devices/Equipment: Environmental consultant (specify type), Eyeglasses, Dentures (specify type), Bedside commode/3-in-1, Oxygen ADL Screening (condition at time of admission) Patient's cognitive  ability adequate to safely complete daily activities?: Yes Is the patient deaf or have difficulty hearing?: No Does the patient have difficulty seeing, even when wearing glasses/contacts?: No Does the patient have difficulty concentrating, remembering, or making decisions?: No Patient able to express need for assistance with ADLs?: Yes Does the patient have difficulty dressing or bathing?: No Independently performs ADLs?: No Communication: Independent Dressing (OT): Needs assistance Is this a change from baseline?: Change from baseline, expected to last >3 days Grooming: Needs assistance Is this a change from baseline?: Change from baseline, expected to last >3 days Feeding: Independent Bathing: Needs assistance Is this a change from baseline?: Pre-admission baseline Toileting: Needs assistance Is this a change from baseline?: Pre-admission baseline In/Out Bed: Needs assistance Is this a change from baseline?: Pre-admission baseline Walks in Home: Needs assistance Is this a change from baseline?: Pre-admission baseline Does the patient have difficulty walking or climbing stairs?: Yes Weakness of Legs: Both Weakness of Arms/Hands: None  Permission Sought/Granted                  Emotional Assessment Appearance:: Appears stated age Attitude/Demeanor/Rapport: Engaged Affect (typically observed): Calm Orientation: : Oriented to Self, Oriented to Place, Oriented to  Time, Oriented to Situation   Psych Involvement: No (comment)  Admission diagnosis:  Emphysematous cystitis [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.80] AKI (acute kidney injury) (HCC) [N17.9] Elevated lipase [R74.8] Patient Active Problem List   Diagnosis Date Noted   AKI (acute kidney injury) (HCC) 05/20/2023   UTI (urinary  tract infection) 05/20/2023   History of cardiac pacemaker 05/01/2023   NSVT (nonsustained ventricular tachycardia) (HCC) 05/01/2023   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 [place] [address] Phone: [phone] Fax: [phone]     Social Determinants of Health (SDOH) Social History: SDOH Screenings   Food Insecurity: No Food Insecurity (05/20/2023)  Housing: Low Risk  (05/20/2023)  Transportation Needs: No Transportation Needs (05/20/2023)  Utilities: Not At Risk (05/20/2023)  Financial Resource Strain: Low Risk  (04/24/2023)   Received from [facility]  Tobacco Use: Medium Risk (05/20/2023)   SDOH Interventions:     Readmission Risk Interventions     No data to display

## 2023-05-23 DIAGNOSIS — N179 Acute kidney failure, unspecified: Secondary | ICD-10-CM | POA: Diagnosis not present

## 2023-05-23 LAB — GLUCOSE, CAPILLARY
Glucose-Capillary: 200 mg/dL — ABNORMAL HIGH (ref 70–99)
Glucose-Capillary: 259 mg/dL — ABNORMAL HIGH (ref 70–99)

## 2023-05-23 MED ORDER — EMPAGLIFLOZIN 25 MG PO TABS: 25.0000 mg | ORAL_TABLET | Freq: Every day | ORAL | 1 refills | Status: DC

## 2023-05-23 MED ORDER — NYSTATIN 100000 UNIT/GM EX POWD: Freq: Three times a day (TID) | CUTANEOUS | 0 refills | Status: DC

## 2023-05-23 MED ORDER — CEPHALEXIN 500 MG PO CAPS: 500.0000 mg | ORAL_CAPSULE | Freq: Two times a day (BID) | ORAL | 0 refills | Status: AC

## 2023-05-23 NOTE — Discharge Summary (Signed)
Physician Discharge Summary   [name] MRN: [id number] DOB: 28-Apr-1941  Admit date:     05/20/2023  Discharge date: 05/23/23  Discharge Physician: Enedina Finner   PCP: Dortha Kern, MD   Recommendations at discharge:    F/u [facility] urology on your upcoming appt on 06/03/23 F/u PCP in 1 week Do CIC tid prn as per Urology instruction  Discharge Diagnoses: Principal Problem:   AKI (acute kidney injury) (HCC) Active Problems:   Acute hypoxemic respiratory failure (HCC)   Urinary retention   UTI (urinary tract infection)   Hypertension   History of cardiac pacemaker   History of ETOH abuse   Type 2 diabetes mellitus, without long-term current use of insulin (HCC)   BPH (benign prostatic hyperplasia)   Chronic obstructive pulmonary disease (COPD) (HCC)   Generalized weakness Wesley Wilson is a 82 year old male with history of BPH, with history of Foley catheter to recent transition to clean intermittent catheterization, aortic atherosclerosis, colonic diverticulosis, hypertension, depression, anxiety, non-insulin-dependent diabetes mellitus admitted for AKI, UTI, Mild - moderate Hyponatremia     AKI (acute kidney injury) (HCC): Cr improving from 2.34--> 1.62 --1.02 (Baseline Cr is 1.16-1.26) --Likely 2/2 UTI and prerenal from poor p.o. intake and urinary retention --d/c IVF.   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 --pt urinated about 1300 cc over 24 hours --He is advised to f/u [facility] urology on 06/03/23 --CIC as needed per urology instructions--pt has all the supplies  Acute hypoxemic respiratory failure (HCC)/h/o COPD Per ED mentation, patient SpO2 desatted to 88% on room air while sleeping Query undiagnosed obstructive sleep apnea  --sats 99% on RA --pt uses oxygen at night time   Hypertension: BP soft around 100-108 since admission Hold antihypertensives (on lisinopril 40 mg daily, metoprolol succinate 25 mg at home) --bp stable--resumed above meds at d/c   Chronic obstructive pulmonary disease: not in acute exacerbation --stable   BPH (benign prostatic hyperplasia):  Continue Tamsulosin 0.4 mg daily & Finasteride 5 mg daily    Type 2 diabetes mellitus, without long-term current use of insulin (HCC): controlled  (Pt is on Jardiance 25 mg daily, Trulicity subcutaneous once a week injection at home)- resume at discharge  Continue Insulin SSI with at bedtime coverage ordered   generalized weakness deconditioning. PT recommends rehab. Patient does not want to go to rehab. TOC for discharge planning discussed with patient regarding home health arrangement.  Overall improving. Pt eager to go home. He will f/u PCP and [facility] urology.       Disposition: Home health Diet recommendation:  Discharge Diet Orders (From admission, onward)     Start     Ordered   05/23/23 0000  Diet - low sodium heart healthy        05/23/23 1132            DISCHARGE MEDICATION: Allergies as of 05/23/2023   No Known Allergies  Medication List     STOP taking these medications    sulfamethoxazole-trimethoprim 800-160 MG tablet Commonly known as: BACTRIM DS       TAKE these medications    acetaminophen 500 MG tablet Commonly known as: TYLENOL Take by mouth.   albuterol 108 (90 Base) MCG/ACT inhaler Commonly known as:  VENTOLIN HFA Inhale 2 puffs into the lungs every 6 (six) hours as needed for wheezing or shortness of breath.   atorvastatin 20 MG tablet Commonly known as: LIPITOR Take 20 mg by mouth daily.   cephALEXin 500 MG capsule Commonly known as: KEFLEX Take 1 capsule (500 mg total) by mouth every 12 (twelve) hours for 2 days.   chlordiazePOXIDE 10 MG capsule Commonly known as: LIBRIUM Take 10 mg by mouth 3 (three) times daily as needed for anxiety.   diclofenac Sodium 1 % Gel Commonly known as: VOLTAREN Apply 2 g topically 4 (four) times daily.   empagliflozin 25 MG Tabs tablet Commonly known as: Jardiance Take 1 tablet (25 mg total) by mouth daily. Start taking on: May 27, 2023 What changed: These instructions start on May 27, 2023. If you are unsure what to do until then, ask your doctor or other care provider.   finasteride 5 MG tablet Commonly known as: PROSCAR Take 1 tablet by mouth daily.   fluticasone-salmeterol 250-50 MCG/ACT Aepb Commonly known as: ADVAIR Inhale 1 puff into the lungs in the morning and at bedtime.   lisinopril 40 MG tablet Commonly known as: ZESTRIL Take 0.5 tablets (20 mg total) by mouth daily.   metoprolol succinate 25 MG 24 hr tablet Commonly known as: TOPROL-XL Take 1 tablet (25 mg total) by mouth daily.   nystatin powder Commonly known as: MYCOSTATIN/NYSTOP Apply topically 3 (three) times daily.   senna-docusate 8.6-50 MG tablet Commonly known as: Senokot-S Take 2 tablets by mouth daily.   sertraline 25 MG tablet Commonly known as: ZOLOFT Take 25 mg by mouth daily.   tadalafil 5 MG tablet Commonly known as: CIALIS Take 5 mg by mouth daily.   tamsulosin 0.4 MG Caps capsule Commonly known as: FLOMAX TAKE TWO CAPSULES BY MOUTH EVERY EVENING   Trulicity 4.5 MG/0.5ML Sopn Generic drug: Dulaglutide SMARTSIG:4.5 Milligram(s) SUB-Q Once a Week   umeclidinium bromide 62.5 MCG/ACT Aepb Commonly known as: INCRUSE ELLIPTA Inhale 1  puff into the lungs daily.        Follow-up Information     Dortha Kern, MD. Schedule an appointment as soon as possible for a visit in 1 week(s).   Specialty: Family Medicine Contact information: [address] [place] [address] [phone]                Discharge Exam: Ceasar Mons Weights   05/20/23 0805  Weight: 96 kg   GENERAL:  82 y.o.-year-old patient with no acute distress.  LUNGS: Normal breath sounds bilaterally, no wheezing CARDIOVASCULAR: S1, S2 normal. No murmur   ABDOMEN: Soft, nontender, nondistended. Bowel sounds present.  EXTREMITIES: No  edema b/l.    NEUROLOGIC: nonfocal  patient is alert and awake  Condition at discharge: fair  The results of significant diagnostics from this hospitalization (including imaging, microbiology, ancillary and laboratory) are listed below for reference.   Imaging Studies: US Abdomen Limited RUQ (LIVER/GB)  Result Date: 05/20/2023 CLINICAL DATA:  Transaminitis EXAM: ULTRASOUND ABDOMEN LIMITED RIGHT UPPER QUADRANT COMPARISON:  Same day CT abdomen/pelvis FINDINGS: Gallbladder: Contracted, likely accounting for the mild wall thickening. Is no shadowing stones or pericholecystic fluid. Sonographic  Murphy's sign was reported negative. Common bile duct: Diameter: 8 mm, within normal limits for age Liver: No focal lesion identified. Within normal limits in parenchymal echogenicity. Portal vein is patent on color Doppler imaging with normal direction of blood flow towards the liver. Other: None. IMPRESSION: Normal right upper quadrant ultrasound. Electronically Signed   By: Lesia Hausen M.D.   On: 05/20/2023 11:45   CT ABDOMEN PELVIS WO CONTRAST  Result Date: 05/20/2023 CLINICAL DATA:  Abdominal pain, dysuria EXAM: CT ABDOMEN AND PELVIS WITHOUT CONTRAST TECHNIQUE: Multidetector CT imaging of the abdomen and pelvis was performed following the standard protocol without IV contrast. RADIATION DOSE REDUCTION: This exam was performed  according to the departmental dose-optimization program which includes automated exposure control, adjustment of the mA and/or kV according to patient size and/or use of iterative reconstruction technique. COMPARISON:  None Available. FINDINGS: Lower chest: Emphysematous lung changes with bibasilar atelectasis. Hepatobiliary: Unremarkable unenhanced appearance of the liver. No focal liver lesion identified. Gallbladder within normal limits. No hyperdense gallstone. No biliary dilatation. Pancreas: Unremarkable. No pancreatic ductal dilatation or surrounding inflammatory changes. Spleen: Normal in size without focal abnormality. Adrenals/Urinary Tract: Unremarkable adrenal glands. Simple left renal cysts, which do not require follow-up imaging. Kidneys appear otherwise unremarkable. No renal stone or hydronephrosis. Small-moderate amount of air within the nondependent portion of the urinary bladder lumen. There are also several foci of air which appear to be located within the wall of the urinary bladder. Multiple bladder wall diverticula are present, largest arising from the right posterolateral aspect of the bladder. Stomach/Bowel: Stomach is within normal limits. Appendix appears normal. Extensive colonic diverticulosis. No evidence of bowel wall thickening, distention, or inflammatory changes. Vascular/Lymphatic: Aortic atherosclerosis. No enlarged abdominal or pelvic lymph nodes. Reproductive: Prostate is unremarkable. Other: No free fluid. No abdominopelvic fluid collection. No pneumoperitoneum. Musculoskeletal: No acute or significant osseous findings. Advanced multilevel lumbar spondylosis. Probable intraosseous hemangioma in the L4 vertebral body. IMPRESSION: 1. Small-moderate amount of air within the nondependent portion of the urinary bladder lumen. There are also several foci of air which appear to be located within the wall of the urinary bladder. Findings are concerning for emphysematous cystitis. 2.  Extensive colonic diverticulosis without evidence of acute diverticulitis. 3. Aortic Atherosclerosis (ICD10-I70.0) and Emphysema (ICD10-J43.9). Electronically Signed   By: Duanne Guess D.O.   On: 05/20/2023 10:03   DG Chest Port 1 View  Result Date: 05/03/2023 CLINICAL DATA:  Shortness of breath EXAM: PORTABLE CHEST 1 VIEW COMPARISON:  Prior chest x-ray 05/01/2023 FINDINGS: Left subclavian approach cardiac rhythm maintenance device. Leads terminate in the right atrium and right ventricle. Extensive diffuse bronchitic changes bilaterally similar [place] prior images. No new airspace opacity, pleural effusion or pneumothorax. Stable bibasilar atelectasis versus scarring. A IMPRESSION: Stable chest x-ray without evidence of acute cardiopulmonary process. Stable chronic bronchitic changes and bibasilar scarring/atelectasis. Electronically Signed   By: Malachy Moan M.D.   On: 05/03/2023 13:34   DG Chest Portable 1 View  Result Date: 05/01/2023 CLINICAL DATA:  Hypoxia EXAM: PORTABLE CHEST 1 VIEW COMPARISON:  Chest x-ray 11/24/2018.  CT of the chest 06/14/2019 FINDINGS: Pleural-parenchymal scarring/opacities are stable from prior. There are new patchy airspace opacities in both lower lungs. Costophrenic angles are clear. Cardiomediastinal silhouette is within normal limits. Left-sided pacemaker is present. No acute fractures are seen. IMPRESSION: 1. New patchy airspace opacities in both lower lungs concerning for pneumonia. 2. Stable pleural-parenchymal scarring/opacities. Electronically Signed   By: Darliss Cheney M.D.   On: 05/01/2023 18:50  Microbiology: Results for orders placed or performed during the hospital encounter of 05/20/23  Urine Culture     Status: Abnormal   Collection Time: 05/20/23  8:31 AM   Specimen: Urine, Random  Result Value Ref Range Status   Specimen Description   Final    URINE, RANDOM Performed at [facility], [address] [place], [place] [address]     Special Requests   Final    NONE Reflexed from [id number] Performed at [facility], [address] [place], [place] [address]    Culture (A)  Final    <10,000 COLONIES/mL INSIGNIFICANT GROWTH Performed at [facility], [address], [place], [place] [address]    Report Status 05/21/2023 FINAL  Final  Blood Culture (routine x 2)     Status: None (Preliminary result)   Collection Time: 05/20/23 11:26 AM   Specimen: BLOOD  Result Value Ref Range Status   Specimen Description BLOOD LEFT ANTECUBITAL  Final   Special Requests   Final    BOTTLES DRAWN AEROBIC AND ANAEROBIC Blood Culture adequate volume   Culture   Final    NO GROWTH 3 DAYS Performed at [facility], [address] [place], [place] [address]    Report Status PENDING  Incomplete  Blood Culture (routine x 2)     Status: None (Preliminary result)   Collection Time: 05/20/23 11:26 AM   Specimen: BLOOD  Result Value Ref Range Status   Specimen Description BLOOD RIGHT ANTECUBITAL  Final   Special Requests   Final    BOTTLES DRAWN AEROBIC AND ANAEROBIC Blood Culture adequate volume   Culture   Final    NO GROWTH 3 DAYS Performed at [facility], [address] [place], [place] [address]    Report Status PENDING  Incomplete    Labs: CBC: Recent Labs  Lab 05/20/23 0831 05/21/23 0353 05/22/23 0349  WBC 9.4 6.8 6.2  NEUTROABS 8.4*  --   --   HGB 12.1* 10.6* 10.1*  HCT 37.1* 30.7* 29.9*  MCV 89.4 86.2 86.9  PLT 222 192 214   Basic Metabolic Panel: Recent Labs  Lab 05/20/23 0831 05/21/23 0353 05/22/23 0349  NA 130* 130* 133*  K 5.0 4.2 4.1  CL 98 102 108  CO2 21* 20* 21*  GLUCOSE 301* 171* 131*  BUN 42* 32* 21  CREATININE 2.34* 1.62* 1.07  CALCIUM 8.1* 7.7* 7.7*   Liver Function Tests: Recent Labs  Lab 05/20/23 0831  AST 77*  ALT 82*  ALKPHOS 135*  BILITOT 1.5*  PROT 6.7  ALBUMIN 2.7*   CBG: Recent Labs  Lab 05/22/23 0853 05/22/23 1254 05/22/23 1657  05/22/23 2121 05/23/23 0751  GLUCAP 147* 204* 167* 173* 200*    Discharge time spent: greater than 30 minutes.  Signed: Enedina Finner, MD [facility] 05/23/2023

## 2023-05-23 NOTE — TOC Transition Note (Addendum)
Transition of Care [facility]) - CM/SW Discharge Note   Patient Details  Name: Wesley Wilson MRN: [id number] Date of Birth: [birth year]  [facility] CM/SW Contact:  Hetty Ely, RN Phone Number: 05/23/2023, 12:28 PM    Final next level of care: Home w Home Health Services Barriers to Discharge: Barriers Resolved   Patient Goals and CMS Choice   Choice offered to / list presented to : Patient  Discharge Placement Patient declined SNF, WILL discharge home with [facility].     Patient and family notified of of transfer: 05/23/23  Discharge Plan and Services Additional resources added to the After Visit Summary for       Post Acute Care Choice: Home Health          DME Arranged: N/A DME Agency: NA       HH Arranged: RN, OT, PT, Social Work [employer] Agency: [facility] Date HH Agency Contacted: 05/23/23 Time HH Agency Contacted: [id number] notified, HH services confirmed. Representative spoke with at [facility] Agency: Bjorn Loser  Social Determinants of Health (SDOH) Interventions SDOH Screenings   Food Insecurity: No Food Insecurity (05/20/2023)  Housing: Low Risk  (05/20/2023)  Transportation Needs: No Transportation Needs (05/20/2023)  Utilities: Not At Risk (05/20/2023)  Financial Resource Strain: Low Risk  (04/24/2023)   Received from [facility]  Tobacco Use: Medium Risk (05/20/2023)     Readmission Risk Interventions     No data to display

## 2023-05-23 NOTE — Discharge Instructions (Addendum)
Pt advised to do CIC tid prn Pt advised to keep his upcoming appt at [facility] urology on 06/03/23

## 2023-05-23 NOTE — Progress Notes (Signed)
Discharge instructions reviewed with patient including followup visits and new medications.  Understanding was verbalized and all questions were answered.  IV removed without complication; patient tolerated well.  Patient currently awaiting ride.

## 2023-05-23 NOTE — Care Management Important Message (Signed)
Important Message  Patient Details  Name: Wesley Wilson MRN: [id number] Date of Birth: [birth year]   Medicare Important Message Given:  Yes  Patient asleep during time of visit, no family in room. Copy of Medicare IM left in room on windowsill for reference.     Johnell Comings 05/23/2023, 10:55 AM

## 2023-05-23 NOTE — TOC Transition Note (Signed)
Transition of Care [facility]) - CM/SW Discharge Note   Patient Details  Name: Wesley Wilson MRN: [id number] Date of Birth: 11-Dec-1940  [facility] CM/SW Contact:  Hetty Ely, RN Phone Number: 05/23/2023, 3:25 PM   Clinical Narrative:  Home Health services will be provided by [facility] confirmed. [facility] called and cancelled, spoke with Adelina Mings who understands [facility] was arranged already by prior CM.     Final next level of care: Home w Home Health Services Barriers to Discharge: No Barriers Identified   Patient Goals and CMS Choice CMS Medicare.gov Compare Post Acute Care list provided to:: Patient Choice offered to / list presented to : Patient  Discharge Placement                      Patient and family notified of of transfer: 05/04/23  Discharge Plan and Services Additional resources added to the After Visit Summary for     Discharge Planning Services: CM Consult            DME Arranged: Oxygen DME Agency: [employer] Date DME Agency Contacted: 05/04/23 Time DME Agency Contacted: 1100 Representative spoke with at DME Agency: Jonny Ruiz HH Arranged: RN, PT, OT, Nurse's Aide HH Agency: [facility] (Patient is active with [facility]) Date HH Agency Contacted: 05/04/23 Time HH Agency Contacted: 1100 Representative spoke with at [facility] Agency: Elnita Maxwell  Social Determinants of Health (SDOH) Interventions SDOH Screenings   Food Insecurity: No Food Insecurity (05/20/2023)  Housing: Low Risk  (05/20/2023)  Transportation Needs: No Transportation Needs (05/20/2023)  Utilities: Not At Risk (05/20/2023)  Financial Resource Strain: Low Risk  (04/24/2023)   Received from [facility]  Tobacco Use: Medium Risk (05/20/2023)     Readmission Risk Interventions     No data to display

## 2023-12-18 ENCOUNTER — Encounter: Payer: Self-pay | Admitting: Emergency Medicine

## 2023-12-18 ENCOUNTER — Inpatient Hospital Stay
Admission: EM | Admit: 2023-12-18 | Discharge: 2023-12-23 | DRG: 871 | Disposition: A | Discharge status: Home Health | Attending: Internal Medicine | Admitting: Internal Medicine

## 2023-12-18 ENCOUNTER — Emergency Department

## 2023-12-18 ENCOUNTER — Other Ambulatory Visit: Payer: Self-pay

## 2023-12-18 DIAGNOSIS — T380X5A Adverse effect of glucocorticoids and synthetic analogues, initial encounter: Secondary | ICD-10-CM | POA: Diagnosis present

## 2023-12-18 DIAGNOSIS — J441 Chronic obstructive pulmonary disease with (acute) exacerbation: Secondary | ICD-10-CM | POA: Diagnosis present

## 2023-12-18 DIAGNOSIS — A419 Sepsis, unspecified organism: Secondary | ICD-10-CM | POA: Diagnosis present

## 2023-12-18 DIAGNOSIS — I5032 Chronic diastolic (congestive) heart failure: Secondary | ICD-10-CM | POA: Diagnosis present

## 2023-12-18 DIAGNOSIS — E1122 Type 2 diabetes mellitus with diabetic chronic kidney disease: Secondary | ICD-10-CM | POA: Diagnosis present

## 2023-12-18 DIAGNOSIS — N179 Acute kidney failure, unspecified: Secondary | ICD-10-CM | POA: Diagnosis present

## 2023-12-18 DIAGNOSIS — D649 Anemia, unspecified: Secondary | ICD-10-CM | POA: Diagnosis present

## 2023-12-18 DIAGNOSIS — I130 Hypertensive heart and chronic kidney disease with heart failure and stage 1 through stage 4 chronic kidney disease, or unspecified chronic kidney disease: Secondary | ICD-10-CM | POA: Diagnosis present

## 2023-12-18 DIAGNOSIS — E871 Hypo-osmolality and hyponatremia: Secondary | ICD-10-CM | POA: Diagnosis not present

## 2023-12-18 DIAGNOSIS — G9341 Metabolic encephalopathy: Secondary | ICD-10-CM | POA: Diagnosis present

## 2023-12-18 DIAGNOSIS — N1831 Chronic kidney disease, stage 3a: Secondary | ICD-10-CM | POA: Diagnosis present

## 2023-12-18 DIAGNOSIS — E1165 Type 2 diabetes mellitus with hyperglycemia: Secondary | ICD-10-CM | POA: Diagnosis present

## 2023-12-18 DIAGNOSIS — Z794 Long term (current) use of insulin: Secondary | ICD-10-CM

## 2023-12-18 DIAGNOSIS — F1010 Alcohol abuse, uncomplicated: Secondary | ICD-10-CM | POA: Diagnosis present

## 2023-12-18 DIAGNOSIS — Z8249 Family history of ischemic heart disease and other diseases of the circulatory system: Secondary | ICD-10-CM

## 2023-12-18 DIAGNOSIS — J189 Pneumonia, unspecified organism: Secondary | ICD-10-CM | POA: Diagnosis present

## 2023-12-18 DIAGNOSIS — E119 Type 2 diabetes mellitus without complications: Secondary | ICD-10-CM

## 2023-12-18 DIAGNOSIS — I2489 Other forms of acute ischemic heart disease: Secondary | ICD-10-CM | POA: Diagnosis present

## 2023-12-18 DIAGNOSIS — Z1152 Encounter for screening for COVID-19: Secondary | ICD-10-CM | POA: Diagnosis not present

## 2023-12-18 DIAGNOSIS — J440 Chronic obstructive pulmonary disease with acute lower respiratory infection: Secondary | ICD-10-CM | POA: Diagnosis present

## 2023-12-18 DIAGNOSIS — R7989 Other specified abnormal findings of blood chemistry: Secondary | ICD-10-CM | POA: Insufficient documentation

## 2023-12-18 DIAGNOSIS — G9340 Encephalopathy, unspecified: Secondary | ICD-10-CM | POA: Insufficient documentation

## 2023-12-18 DIAGNOSIS — R6520 Severe sepsis without septic shock: Secondary | ICD-10-CM | POA: Diagnosis present

## 2023-12-18 DIAGNOSIS — J9601 Acute respiratory failure with hypoxia: Secondary | ICD-10-CM | POA: Diagnosis present

## 2023-12-18 DIAGNOSIS — R339 Retention of urine, unspecified: Secondary | ICD-10-CM | POA: Diagnosis not present

## 2023-12-18 DIAGNOSIS — R338 Other retention of urine: Secondary | ICD-10-CM | POA: Diagnosis present

## 2023-12-18 DIAGNOSIS — Z87891 Personal history of nicotine dependence: Secondary | ICD-10-CM

## 2023-12-18 DIAGNOSIS — F1011 Alcohol abuse, in remission: Secondary | ICD-10-CM | POA: Diagnosis present

## 2023-12-18 DIAGNOSIS — Z950 Presence of cardiac pacemaker: Secondary | ICD-10-CM

## 2023-12-18 DIAGNOSIS — I10 Essential (primary) hypertension: Secondary | ICD-10-CM

## 2023-12-18 DIAGNOSIS — Z79899 Other long term (current) drug therapy: Secondary | ICD-10-CM

## 2023-12-18 DIAGNOSIS — Z7984 Long term (current) use of oral hypoglycemic drugs: Secondary | ICD-10-CM

## 2023-12-18 DIAGNOSIS — N183 Chronic kidney disease, stage 3 unspecified: Secondary | ICD-10-CM | POA: Insufficient documentation

## 2023-12-18 DIAGNOSIS — I5A Non-ischemic myocardial injury (non-traumatic): Secondary | ICD-10-CM | POA: Diagnosis present

## 2023-12-18 DIAGNOSIS — Z8042 Family history of malignant neoplasm of prostate: Secondary | ICD-10-CM

## 2023-12-18 DIAGNOSIS — N189 Chronic kidney disease, unspecified: Secondary | ICD-10-CM | POA: Diagnosis not present

## 2023-12-18 DIAGNOSIS — E785 Hyperlipidemia, unspecified: Secondary | ICD-10-CM | POA: Diagnosis present

## 2023-12-18 DIAGNOSIS — R0603 Acute respiratory distress: Secondary | ICD-10-CM

## 2023-12-18 DIAGNOSIS — Z7985 Long-term (current) use of injectable non-insulin antidiabetic drugs: Secondary | ICD-10-CM

## 2023-12-18 DIAGNOSIS — N401 Enlarged prostate with lower urinary tract symptoms: Secondary | ICD-10-CM | POA: Diagnosis present

## 2023-12-18 DIAGNOSIS — J9621 Acute and chronic respiratory failure with hypoxia: Secondary | ICD-10-CM | POA: Diagnosis present

## 2023-12-18 DIAGNOSIS — Z7951 Long term (current) use of inhaled steroids: Secondary | ICD-10-CM

## 2023-12-18 LAB — URINALYSIS, W/ REFLEX TO CULTURE (INFECTION SUSPECTED)
Bacteria, UA: NONE SEEN
Bilirubin Urine: NEGATIVE
Glucose, UA: >=500 mg/dL — AB
Hgb urine dipstick: NEGATIVE
Ketones, ur: NEGATIVE mg/dL
Leukocytes,Ua: NEGATIVE
Nitrite: NEGATIVE
Protein, ur: NEGATIVE mg/dL
Specific Gravity, Urine: 1.027 (ref 1.005–1.030)
Squamous Epithelial / HPF: 0 /HPF (ref 0–5)
pH: 5 (ref 5.0–8.0)

## 2023-12-18 LAB — PROTIME-INR
INR: 1.3 — ABNORMAL HIGH (ref 0.8–1.2)
Prothrombin Time: 16 s — ABNORMAL HIGH (ref 11.4–15.2)

## 2023-12-18 LAB — CBC WITH DIFFERENTIAL/PLATELET
Abs Immature Granulocytes: 0.05 10*3/uL (ref 0.00–0.07)
Basophils Absolute: 0 10*3/uL (ref 0.0–0.1)
Basophils Relative: 0 %
Eosinophils Absolute: 0 10*3/uL (ref 0.0–0.5)
Eosinophils Relative: 0 %
HCT: 34.4 % — ABNORMAL LOW (ref 39.0–52.0)
Hemoglobin: 11.4 g/dL — ABNORMAL LOW (ref 13.0–17.0)
Immature Granulocytes: 1 %
Lymphocytes Relative: 12 %
Lymphs Abs: 1.3 10*3/uL (ref 0.7–4.0)
MCH: 29.2 pg (ref 26.0–34.0)
MCHC: 33.1 g/dL (ref 30.0–36.0)
MCV: 88.2 fL (ref 80.0–100.0)
Monocytes Absolute: 0.7 10*3/uL (ref 0.1–1.0)
Monocytes Relative: 6 %
Neutro Abs: 8.7 10*3/uL — ABNORMAL HIGH (ref 1.7–7.7)
Neutrophils Relative %: 81 %
Platelets: 212 10*3/uL (ref 150–400)
RBC: 3.9 MIL/uL — ABNORMAL LOW (ref 4.22–5.81)
RDW: 14.2 % (ref 11.5–15.5)
WBC: 10.7 10*3/uL — ABNORMAL HIGH (ref 4.0–10.5)
nRBC: 0 % (ref 0.0–0.2)

## 2023-12-18 LAB — RESP PANEL BY RT-PCR (RSV, FLU A&B, COVID)  RVPGX2
Influenza A by PCR: NEGATIVE
Influenza B by PCR: NEGATIVE
Resp Syncytial Virus by PCR: NEGATIVE
SARS Coronavirus 2 by RT PCR: NEGATIVE

## 2023-12-18 LAB — COMPREHENSIVE METABOLIC PANEL
ALT: 39 U/L (ref 0–44)
AST: 19 U/L (ref 15–41)
Albumin: 2.6 g/dL — ABNORMAL LOW (ref 3.5–5.0)
Alkaline Phosphatase: 72 U/L (ref 38–126)
Anion gap: 8 (ref 5–15)
BUN: 45 mg/dL — ABNORMAL HIGH (ref 8–23)
CO2: 23 mmol/L (ref 22–32)
Calcium: 7.8 mg/dL — ABNORMAL LOW (ref 8.9–10.3)
Chloride: 100 mmol/L (ref 98–111)
Creatinine, Ser: 1.56 mg/dL — ABNORMAL HIGH (ref 0.61–1.24)
GFR, Estimated: 45 mL/min — ABNORMAL LOW (ref >60)
Glucose, Bld: 296 mg/dL — ABNORMAL HIGH (ref 70–99)
Potassium: 3.8 mmol/L (ref 3.5–5.1)
Sodium: 131 mmol/L — ABNORMAL LOW (ref 135–145)
Total Bilirubin: 1.1 mg/dL (ref 0.0–1.2)
Total Protein: 6.3 g/dL — ABNORMAL LOW (ref 6.5–8.1)

## 2023-12-18 LAB — STREP PNEUMONIAE URINARY ANTIGEN: Strep Pneumo Urinary Antigen: NEGATIVE

## 2023-12-18 LAB — LIPASE, BLOOD: Lipase: 44 U/L (ref 11–51)

## 2023-12-18 LAB — BRAIN NATRIURETIC PEPTIDE: B Natriuretic Peptide: 854.8 pg/mL — ABNORMAL HIGH (ref 0.0–100.0)

## 2023-12-18 LAB — TROPONIN I (HIGH SENSITIVITY)
Troponin I (High Sensitivity): 30 ng/L — ABNORMAL HIGH (ref <18)
Troponin I (High Sensitivity): 29 ng/L — ABNORMAL HIGH (ref <18)

## 2023-12-18 LAB — LACTIC ACID, PLASMA
Lactic Acid, Venous: 1.8 mmol/L (ref 0.5–1.9)
Lactic Acid, Venous: 1.3 mmol/L (ref 0.5–1.9)

## 2023-12-18 MED ORDER — CHLORDIAZEPOXIDE HCL 10 MG PO CAPS
10.0000 mg | ORAL_CAPSULE | Freq: Every day | ORAL | Status: DC | PRN
  Administered 2023-12-19: 10 mg via ORAL
  Filled 2023-12-18 (×3): qty 2

## 2023-12-18 MED ORDER — IPRATROPIUM-ALBUTEROL 0.5-2.5 (3) MG/3ML IN SOLN
3.0000 mL | Freq: Four times a day (QID) | RESPIRATORY_TRACT | Status: DC
  Administered 2023-12-18 – 2023-12-22 (×17): 3 mL via RESPIRATORY_TRACT
  Filled 2023-12-18 (×15): qty 3

## 2023-12-18 MED ORDER — ONDANSETRON HCL 4 MG PO TABS: 4.0000 mg | ORAL_TABLET | Freq: Four times a day (QID) | ORAL | Status: DC | PRN

## 2023-12-18 MED ORDER — SODIUM CHLORIDE 0.9 % IV SOLN
2.0000 g | INTRAVENOUS | Status: AC
  Administered 2023-12-19 – 2023-12-22 (×4): 2 g via INTRAVENOUS
  Filled 2023-12-18 (×4): qty 20

## 2023-12-18 MED ORDER — ENOXAPARIN SODIUM 40 MG/0.4ML IJ SOSY
40.0000 mg | PREFILLED_SYRINGE | INTRAMUSCULAR | Status: DC
  Administered 2023-12-18 – 2023-12-22 (×5): 40 mg via SUBCUTANEOUS
  Filled 2023-12-18 (×5): qty 0.4

## 2023-12-18 MED ORDER — METOPROLOL SUCCINATE ER 25 MG PO TB24
25.0000 mg | ORAL_TABLET | Freq: Every day | ORAL | Status: DC
  Administered 2023-12-18 – 2023-12-23 (×6): 25 mg via ORAL
  Filled 2023-12-18 (×6): qty 1

## 2023-12-18 MED ORDER — FINASTERIDE 5 MG PO TABS
5.0000 mg | ORAL_TABLET | Freq: Every day | ORAL | Status: DC
  Administered 2023-12-19 – 2023-12-23 (×5): 5 mg via ORAL
  Filled 2023-12-18 (×6): qty 1

## 2023-12-18 MED ORDER — PREDNISONE 20 MG PO TABS
40.0000 mg | ORAL_TABLET | Freq: Every day | ORAL | Status: AC
  Administered 2023-12-19 – 2023-12-22 (×4): 40 mg via ORAL
  Filled 2023-12-18 (×4): qty 2

## 2023-12-18 MED ORDER — IPRATROPIUM-ALBUTEROL 0.5-2.5 (3) MG/3ML IN SOLN
6.0000 mL | Freq: Once | RESPIRATORY_TRACT | Status: DC
  Filled 2023-12-18: qty 3

## 2023-12-18 MED ORDER — LACTATED RINGERS IV SOLN: INTRAVENOUS | Status: AC

## 2023-12-18 MED ORDER — ALBUTEROL SULFATE (2.5 MG/3ML) 0.083% IN NEBU: 2.5000 mg | INHALATION_SOLUTION | RESPIRATORY_TRACT | Status: DC | PRN

## 2023-12-18 MED ORDER — LACTATED RINGERS IV BOLUS: 500.0000 mL | Freq: Once | INTRAVENOUS | Status: DC

## 2023-12-18 MED ORDER — SODIUM CHLORIDE 0.9 % IV BOLUS
500.0000 mL | Freq: Once | INTRAVENOUS | Status: AC
  Administered 2023-12-18: 500 mL via INTRAVENOUS

## 2023-12-18 MED ORDER — SODIUM CHLORIDE 0.9 % IV SOLN
2.0000 g | Freq: Once | INTRAVENOUS | Status: AC
  Administered 2023-12-18: 2 g via INTRAVENOUS
  Filled 2023-12-18: qty 12.5

## 2023-12-18 MED ORDER — SODIUM CHLORIDE 0.9 % IV SOLN
500.0000 mg | INTRAVENOUS | Status: AC
  Administered 2023-12-18 – 2023-12-22 (×5): 500 mg via INTRAVENOUS
  Filled 2023-12-18 (×5): qty 5

## 2023-12-18 MED ORDER — VANCOMYCIN HCL IN DEXTROSE 1-5 GM/200ML-% IV SOLN
1000.0000 mg | Freq: Once | INTRAVENOUS | Status: AC
Start: 2023-12-18 — End: 2023-12-18
  Administered 2023-12-18: 1000 mg via INTRAVENOUS
  Filled 2023-12-18: qty 200

## 2023-12-18 MED ORDER — ONDANSETRON HCL 4 MG/2ML IJ SOLN: 4.0000 mg | Freq: Four times a day (QID) | INTRAMUSCULAR | Status: DC | PRN

## 2023-12-18 MED ORDER — METHYLPREDNISOLONE SODIUM SUCC 125 MG IJ SOLR
80.0000 mg | Freq: Every day | INTRAMUSCULAR | Status: AC
Start: 2023-12-18 — End: 2023-12-18
  Administered 2023-12-18: 80 mg via INTRAVENOUS
  Filled 2023-12-18: qty 2

## 2023-12-18 NOTE — Assessment & Plan Note (Signed)
 Marland Kitchen

## 2023-12-18 NOTE — Assessment & Plan Note (Signed)
 Rate controlled at present  EKG stable  Monitor

## 2023-12-18 NOTE — Assessment & Plan Note (Deleted)
 Troponin 30-->29 No active chest pain EKG stable Suspect minimal to mild heart strain in the setting of decompensated respiratory failure, COPD, pneumonia Monitor

## 2023-12-18 NOTE — ED Notes (Signed)
 This RN received report from Leanna Sato Paramedic and performed care handoff. This RN introduced self to pt. Call light in reach, bed wheels locked, side rail raised, pt updated on plan of care. Rounding completed.

## 2023-12-18 NOTE — Consult Note (Signed)
 CODE SEPSIS - PHARMACY COMMUNICATION  **Broad Spectrum Antibiotics should be administered within 1 hour of Sepsis diagnosis**  Time Code Sepsis Called/Page Received: 1151  Antibiotics Ordered: Cefepime, Vancomycin, & Azithromycin  Time of 1st antibiotic administration: 1229  Additional action taken by pharmacy: N/A  If necessary, Name of Provider/Nurse Contacted: N/A  Littie Deeds, PharmD Pharmacy Resident  12/18/2023 11:54 AM

## 2023-12-18 NOTE — H&P (Signed)
 History and Physical    [name]:[id number] DOB: Mar 21, 1941 DOA: 12/18/2023 DOS: the patient was seen and examined on 12/18/2023 PCP: Dortha Kern, MD  Patient coming from: Home  Chief Complaint:  Chief Complaint  Patient presents with   Respiratory Distress   HPI: Wesley Wilson is a 83 y.o. male with medical history significant of type 2 diabetes, hypertension, stage III CKD, COPD, pacemaker placement presenting with acute respiratory failure with hypoxia, pneumonia, COPD exacerbation, encephalopathy.  History primarily from family in the setting of lethargy/encephalopathy.  Per report, family reports increased work of breathing, shortness of breath, cough wheezing over the past 2 to 3 days.  No reported sick contacts.?  Mild viral URI symptoms prior to onset.  No reported chest pain.  Does wear oxygen intermittently at home though this is not a regular occurrence.  No reported alcohol or tobacco use.  Does report prior longstanding alcohol abuse 2 decades ago-now on regular Librium.  No reported abdominal pain or diarrhea.  No focal hemiparesis or confusion. Presented to the ER Tmax 99, heart rate stable, hypoxic to 84% on room air.  Respirations into the upper 20s.  Placed on high flow nasal cannula.  White count 10.7, hemoglobin 11.4, platelets 212, lactate 1.3, troponin 20s to 30s.  VBG stable.  COVID flu and RSV negative.  Creatinine 1.56.  Glucose 293.  BNP 855.  Chest x-ray with multilobar pneumonia. Review of Systems: As mentioned in the history of present illness. All other systems reviewed and are negative. Past Medical History:  Diagnosis Date   Diabetes mellitus without complication (HCC)    Hypertension    Past Surgical History:  Procedure Laterality Date   CARDIAC PACEMAKER PLACEMENT     Social History:  reports that he quit smoking about 58 years ago. His smoking use included cigarettes. He started smoking about 60 years ago. He has a 0.7 pack-year smoking  history. He has been exposed to tobacco smoke. He has never used smokeless tobacco. He reports that he does not drink alcohol and does not use drugs.  No Known Allergies  Family History  Problem Relation Age of Onset   Hypertension Brother    Prostate cancer Brother     Prior to Admission medications   Medication Sig Start Date End Date Taking? Authorizing Provider  acetaminophen (TYLENOL) 500 MG tablet Take by mouth.    [provider]  albuterol (PROVENTIL HFA;VENTOLIN HFA) 108 (90 Base) MCG/ACT inhaler Inhale 2 puffs into the lungs every 6 (six) hours as needed for wheezing or shortness of breath. 11/05/18   Don Perking, [place], MD  atorvastatin (LIPITOR) 20 MG tablet Take 20 mg by mouth daily. 02/02/16   [provider]  chlordiazePOXIDE (LIBRIUM) 10 MG capsule Take 10 mg by mouth 3 (three) times daily as needed for anxiety. 12/04/18   [provider]  diclofenac Sodium (VOLTAREN) 1 % GEL Apply 2 g topically 4 (four) times daily.    [provider]  empagliflozin (JARDIANCE) 25 MG TABS tablet Take 1 tablet (25 mg total) by mouth daily. 05/27/23   Enedina Finner, MD  finasteride (PROSCAR) 5 MG tablet Take 1 tablet by mouth daily. 11/10/20   [provider]  fluticasone-salmeterol (ADVAIR) 250-50 MCG/ACT AEPB Inhale 1 puff into the lungs in the morning and at bedtime. 04/29/23 04/28/24  [provider]  lisinopril (ZESTRIL) 40 MG tablet Take 0.5 tablets (20 mg total) by mouth daily. 05/04/23   Arnetha Courser, MD  metoprolol succinate (TOPROL-XL) 25 MG 24 hr tablet Take 1 tablet (25 mg total) by mouth daily. 05/04/23   Arnetha Courser, MD  nystatin (MYCOSTATIN/NYSTOP) powder Apply topically 3 (three) times daily. 05/23/23   Enedina Finner, MD  senna-docusate (SENOKOT-S) 8.6-50 MG tablet Take 2 tablets by mouth daily.    [provider]  sertraline (ZOLOFT) 25 MG tablet Take 25 mg by mouth daily. 05/18/23   [provider]  tadalafil (CIALIS)  5 MG tablet Take 5 mg by mouth daily.    [provider]  tamsulosin (FLOMAX) 0.4 MG CAPS capsule TAKE TWO CAPSULES BY MOUTH EVERY EVENING 12/04/18   [provider]  TRULICITY 4.5 MG/0.5ML SOPN SMARTSIG:4.5 Milligram(s) SUB-Q Once a Week 03/15/23   [provider]  umeclidinium bromide (INCRUSE ELLIPTA) 62.5 MCG/ACT AEPB Inhale 1 puff into the lungs daily. 04/29/23   [provider]    Physical Exam: Vitals:   12/18/23 1043 12/18/23 1049 12/18/23 1049 12/18/23 1130  BP:    112/62  Pulse:  99  92  Resp:  (!) 26  (!) 22  Temp:   99 F (37.2 C)   TempSrc:   Oral   SpO2:  91%  95%  Weight: 98.9 kg     Height: 6\' 4"  (1.93 m)      Physical Exam Constitutional:      Appearance: He is obese.  HENT:     Head: Normocephalic and atraumatic.     Nose: Nose normal.  Eyes:     Pupils: Pupils are equal, round, and reactive to light.  Cardiovascular:     Rate and Rhythm: Normal rate and regular rhythm.  Pulmonary:     Effort: Pulmonary effort is normal.     Breath sounds: Wheezing present.  Abdominal:     General: Bowel sounds are normal.  Musculoskeletal:        General: Normal range of motion.     Cervical back: Normal range of motion.  Skin:    General: Skin is warm.  Neurological:     General: No focal deficit present.  Psychiatric:        Mood and Affect: Mood normal.     Data Reviewed:  There are no new results to review at this time.  DG Chest 1 View CLINICAL DATA:  Dyspnea, sepsis  EXAM: CHEST  1 VIEW  COMPARISON:  05/03/2023 chest radiograph.  FINDINGS: Stable 2 lead left subclavian pacemaker with lead tips overlying the right atrium and right ventricle. Stable cardiomediastinal silhouette with normal heart size. No pneumothorax. No significant effusions. Patchy right greater than left parahilar lung consolidation.  IMPRESSION: Patchy right greater than left parahilar lung consolidation, suspicious for multifocal pneumonia.  Chest radiographic follow-up to resolution recommended.  Electronically Signed   By: Delbert Phenix M.D.   On: 12/18/2023 11:04  Lab Results  Component Value Date   WBC 10.7 (H) 12/18/2023   HGB 11.4 (L) 12/18/2023   HCT 34.4 (L) 12/18/2023   MCV 88.2 12/18/2023   PLT 212 12/18/2023   Last metabolic panel Lab Results  Component Value Date   GLUCOSE 296 (H) 12/18/2023   NA 131 (L) 12/18/2023   K 3.8 12/18/2023   CL 100 12/18/2023   CO2 23 12/18/2023   BUN 45 (H) 12/18/2023   CREATININE 1.56 (H) 12/18/2023   GFRNONAA 45 (L) 12/18/2023   CALCIUM 7.8 (L) 12/18/2023   PROT 6.3 (L) 12/18/2023   ALBUMIN 2.6 (L) 12/18/2023   BILITOT 1.1 12/18/2023  ALKPHOS 72 12/18/2023   AST 19 12/18/2023   ALT 39 12/18/2023   ANIONGAP 8 12/18/2023    Assessment and Plan: Acute hypoxemic respiratory failure (HCC) COPD Exacerbation  CAP   Decompensated resp failure requiring HFNC in setting of PNA and COPD exacerbation  Does not wear O2 chronically at home  + cough and wheezing x 4 days  Noted multilobar PNA on imaging  IV rocephin and azithromycin for infectious coverage  IV solumedrol  Duonebs  Blood and resp cultures  Supplemental O2 prn  IS  Monitor    History of cardiac pacemaker Rate controlled at present  EKG stable  Monitor    Type 2 diabetes mellitus, without long-term current use of insulin (HCC) Blood sugar 290s  SSI  A1C  Monitor sugars w/ steroid use    History of ETOH abuse Remote hx/o ETOH- no active use for multiple decades per family  Still benzo dependent per report.  Monitor  Cont home regimen for now    Encephalopathy + generalized lethargy in setting of hypoxemic resp failure and PNA  VBG stable  Monitor mentation with treatment Follow        Advance Care Planning:   Code Status: Prior   Consults: None   Family Communication: Family at the bedside   Severity of Illness: The appropriate patient status for this patient is INPATIENT.  Inpatient status is judged to be reasonable and necessary in order to provide the required intensity of service to ensure the patient's safety. The patient's presenting symptoms, physical exam findings, and initial radiographic and laboratory data in the context of their chronic comorbidities is felt to place them at high risk for further clinical deterioration. Furthermore, it is not anticipated that the patient will be medically stable for discharge from the hospital within 2 midnights of admission.   * I certify that at the point of admission it is my clinical judgment that the patient will require inpatient hospital care spanning beyond 2 midnights from the point of admission due to high intensity of service, high risk for further deterioration and high frequency of surveillance required.*  Author: Floydene Flock, MD 12/18/2023 12:51 PM  For on call review [email or web address].

## 2023-12-18 NOTE — Assessment & Plan Note (Signed)
 Remote hx/o ETOH- no active use for multiple decades per family  Still benzo dependent per report.  Monitor  Cont home regimen for now

## 2023-12-18 NOTE — Assessment & Plan Note (Signed)
 Patient was on Lantus and sliding scale during the hospital course while on steroids.  Can go back on Trulicity as outpatient.

## 2023-12-18 NOTE — Assessment & Plan Note (Signed)
 Cr 1.56 w GFR in the 40s  Near baseline in review of labs over last 7 months  Monitor

## 2023-12-18 NOTE — ED Notes (Signed)
 Meal tray bedside. Family assisting in feeding pt.

## 2023-12-18 NOTE — ED Notes (Signed)
 This RN gave report to [employer] and performed bedside care handoff. Call light in reach, bed wheels locked, side rail raised, pt updated on plan of care. Rounding completed.

## 2023-12-18 NOTE — Sepsis Progress Note (Signed)
 Elink following code sepsis

## 2023-12-18 NOTE — ED Triage Notes (Signed)
 Pt via [facility] from home. Pt c/o SOB for the past week that has gotten worse since Friday. Reports that he has been contact with his brother recently who was dx with the flu. Pt has a hx of COPD but only wear O2 PRN at home. Pt states that his O2 was in the 70s yesterday and was unable to get it up. EMS reports initial sat 70s on RA, pt currently on 6L [place] on arrival and increased to 92%-93%. Pt is A&Ox4 but unable to complete full sentences and tachypneic on arrival. Dr. Arnoldo Morale at bedside and RT called at this time.   EMS started 18G L AC, gave pt 2 Duoneb, 125mg  SoluMedrol, 650mg  of Tylenol. EMS reports:  99.8 orally 281 CBG  143/78 BP  93% on 6L [place]  30 ETCO2  26 RR

## 2023-12-18 NOTE — Consult Note (Addendum)
 ED Pharmacy Antibiotic Sign Off An antibiotic consult was received from an ED provider for vancomycin per pharmacy dosing for pneumonia. A chart review was completed to assess appropriateness.   The following one time order(s) were placed: Vancomycin 1 gm   Further antibiotic and/or antibiotic pharmacy consults should be ordered by the admitting provider if indicated.   Thank you for allowing pharmacy to be a part of this patient's care.   Littie Deeds, PharmD Pharmacy Resident  12/18/2023 12:03 PM

## 2023-12-18 NOTE — ED Provider Notes (Addendum)
 [facility] Provider Note    Event Date/Time   First MD Initiated Contact with Patient 12/18/23 1029     (approximate)   History   Respiratory Distress   HPI  Wesley Wilson is a 83 y.o. male past medical history significant for BPH, intermittent AV block with pacemaker placement, hypertension, hyperlipidemia, prior alcohol use, COPD, who presents to the emergency department with shortness of breath.  Patient states that he started feel poorly approximately 1 week ago on Friday.  Felt like he was having a viral illness with cough and congestion and not feeling well.  States that over the past 3 days he got significantly worse with fever and chills with shortness of breath.  Does endorse a productive cough.  Denies  abdominal pain, nausea or vomiting.  States that he normally does not wear oxygen except for as needed.  When EMS arrived patient's oxygenation was in the 70s.  Patient was placed on 6 L nasal cannula and improved to 93%.  Given DuoNeb treatment, IV Solu-Medrol and Tylenol prior to arrival.     Physical Exam   Triage Vital Signs: ED Triage Vitals  Encounter Vitals Group     BP 12/18/23 1037 130/69     Systolic BP Percentile --      Diastolic BP Percentile --      Pulse Rate 12/18/23 1037 (!) 119     Resp 12/18/23 1037 19     Temp 12/18/23 1049 99 F (37.2 C)     Temp Source 12/18/23 1049 Oral     SpO2 12/18/23 1037 (!) 84 %     Weight 12/18/23 1043 218 lb (98.9 kg)     Height 12/18/23 1043 6\' 4"  (1.93 m)     Head Circumference --      Peak Flow --      Pain Score 12/18/23 1042 0     Pain Loc --      Pain Education --      Exclude from Growth Chart --     Most recent vital signs: Vitals:   12/18/23 1049 12/18/23 1130  BP:  112/62  Pulse:  92  Resp:  (!) 22  Temp: 99 F (37.2 C)   SpO2:  95%    Physical Exam Constitutional:      Appearance: He is well-developed.  HENT:     Head: Atraumatic.  Eyes:     Extraocular  Movements: Extraocular movements intact.     Conjunctiva/sclera: Conjunctivae normal.     Pupils: Pupils are equal, round, and reactive to light.  Cardiovascular:     Rate and Rhythm: Regular rhythm. Tachycardia present.  Pulmonary:     Effort: Respiratory distress present.     Breath sounds: Wheezing and rhonchi (Worse on the right side when compared to the left) present.     Comments: Speaking in 2 word sentences Abdominal:     Tenderness: There is no abdominal tenderness.  Musculoskeletal:     Cervical back: Normal range of motion.     Right lower leg: No edema.     Left lower leg: No edema.  Skin:    General: Skin is warm.  Neurological:     Mental Status: He is alert. Mental status is at baseline.     IMPRESSION / MDM / ASSESSMENT AND PLAN / ED COURSE  I reviewed the triage vital signs and the nursing notes.  On chart review patient had a history of COPD and was  admitted and required oxygen sent home with 2 to 3 L as needed.  Differential diagnosis including COPD exacerbation, viral illness including COVID/influenza, pneumonia, heart failure.  Have low suspicion for pulmonary embolism.  On arrival patient was tachycardic, tachypneic and hypoxic with a low-grade fever and was given Tylenol prior to arrival.  Called respiratory therapy and started on high flow nasal cannula given respiratory distress   EKG  I, Corena Herter, the attending physician, personally viewed and interpreted this ECG.  Paced rhythm.  Negative Sgarbossa's criteria.  QTc 497.  Wide-complex.  No tachycardic or bradycardic dysrhythmias while on cardiac telemetry.  RADIOLOGY Chest x-ray read as patchy right sided lung consolidation concerning for multifocal pneumonia.  LABS (all labs ordered are listed, but only abnormal results are displayed) Labs interpreted as -    Labs Reviewed  COMPREHENSIVE METABOLIC PANEL - Abnormal; Notable for the following components:      Result Value   Sodium 131  (*)    Glucose, Bld 296 (*)    BUN 45 (*)    Creatinine, Ser 1.56 (*)    Calcium 7.8 (*)    Total Protein 6.3 (*)    Albumin 2.6 (*)    GFR, Estimated 45 (*)    All other components within normal limits  CBC WITH DIFFERENTIAL/PLATELET - Abnormal; Notable for the following components:   WBC 10.7 (*)    RBC 3.90 (*)    Hemoglobin 11.4 (*)    HCT 34.4 (*)    Neutro Abs 8.7 (*)    All other components within normal limits  PROTIME-INR - Abnormal; Notable for the following components:   Prothrombin Time 16.0 (*)    INR 1.3 (*)    All other components within normal limits  BLOOD GAS, VENOUS - Abnormal; Notable for the following components:   pCO2, Ven 42 (*)    pO2, Ven <31 (*)    All other components within normal limits  BRAIN NATRIURETIC PEPTIDE - Abnormal; Notable for the following components:   B Natriuretic Peptide 854.8 (*)    All other components within normal limits  TROPONIN I (HIGH SENSITIVITY) - Abnormal; Notable for the following components:   Troponin I (High Sensitivity) 30 (*)    All other components within normal limits  RESP PANEL BY RT-PCR (RSV, FLU A&B, COVID)  RVPGX2  CULTURE, BLOOD (ROUTINE X 2)  CULTURE, BLOOD (ROUTINE X 2)  LACTIC ACID, PLASMA  LIPASE, BLOOD  LACTIC ACID, PLASMA  URINALYSIS, W/ REFLEX TO CULTURE (INFECTION SUSPECTED)  TROPONIN I (HIGH SENSITIVITY)     MDM    Blood cultures obtained.  Given a bolus of IV fluids.  Patient was placed on high flow nasal cannula.  No significant hypercarbia on VBG.  Mild leukocytosis.  Anemia but hemoglobin is stable.  Creatinine at baseline with no significant electrolyte abnormality.  Sodium 131, glucose 296.  Creatinine mildly elevated at 1 point signs 6 from a baseline of 1.  Normal lactic acid.  Mildly elevated BNP.  Troponin also mildly elevated.  Have low suspicion for ACS or heart failure and more consistent with pneumonia.  Given questionable recent viral illness patient started on antibiotics to  cover for Pseudomonas and MRSA given possible postviral pneumonia.  Started on vancomycin, cefepime and azithromycin.  On reevaluation worsening wheezing, given no treatment of DuoNeb.  Consulted hospitalist for admission.   PROCEDURES:  Critical Care performed: yes  .Critical Care  Performed by: Corena Herter, MD Authorized by: Corena Herter, MD  Critical care provider statement:    Critical care time (minutes):  30   Critical care time was exclusive of:  Separately billable procedures and treating other patients   Critical care was necessary to treat or prevent imminent or life-threatening deterioration of the following conditions:  Respiratory failure and sepsis   Critical care was time spent personally by me on the following activities:  Development of treatment plan with patient or surrogate, discussions with consultants, evaluation of patient's response to treatment, examination of patient, ordering and review of laboratory studies, ordering and review of radiographic studies, ordering and performing treatments and interventions, pulse oximetry, re-evaluation of patient's condition and review of old charts   Care discussed with: admitting provider     Patient's presentation is most consistent with acute presentation with potential threat to life or bodily function.   MEDICATIONS ORDERED IN ED: Medications  lactated ringers infusion (has no administration in time range)  ceFEPIme (MAXIPIME) 2 g in sodium chloride 0.9 % 100 mL IVPB (has no administration in time range)  azithromycin (ZITHROMAX) 500 mg in sodium chloride 0.9 % 250 mL IVPB (has no administration in time range)  ipratropium-albuterol (DUONEB) 0.5-2.5 (3) MG/3ML nebulizer solution 6 mL (has no administration in time range)  vancomycin (VANCOCIN) IVPB 1000 mg/200 mL premix (has no administration in time range)  sodium chloride 0.9 % bolus 500 mL (has no administration in time range)    FINAL CLINICAL IMPRESSION(S)  / ED DIAGNOSES   Final diagnoses:  COPD exacerbation (HCC)  Respiratory distress  Community acquired pneumonia of right lung, unspecified part of lung     Rx / DC Orders   ED Discharge Orders     None        Note:  This document was prepared using Dragon voice recognition software and may include unintentional dictation errors.   Corena Herter, MD 12/18/23 0981    Corena Herter, MD 12/18/23 1225

## 2023-12-19 DIAGNOSIS — J9601 Acute respiratory failure with hypoxia: Secondary | ICD-10-CM

## 2023-12-19 LAB — CBG MONITORING, ED
Glucose-Capillary: 580 mg/dL (ref 70–99)
Glucose-Capillary: 477 mg/dL — ABNORMAL HIGH (ref 70–99)
Glucose-Capillary: 370 mg/dL — ABNORMAL HIGH (ref 70–99)
Glucose-Capillary: 193 mg/dL — ABNORMAL HIGH (ref 70–99)

## 2023-12-19 LAB — COMPREHENSIVE METABOLIC PANEL
ALT: 33 U/L (ref 0–44)
AST: 17 U/L (ref 15–41)
Albumin: 2.5 g/dL — ABNORMAL LOW (ref 3.5–5.0)
Alkaline Phosphatase: 71 U/L (ref 38–126)
Anion gap: 8 (ref 5–15)
BUN: 49 mg/dL — ABNORMAL HIGH (ref 8–23)
CO2: 21 mmol/L — ABNORMAL LOW (ref 22–32)
Calcium: 8 mg/dL — ABNORMAL LOW (ref 8.9–10.3)
Chloride: 99 mmol/L (ref 98–111)
Creatinine, Ser: 1.67 mg/dL — ABNORMAL HIGH (ref 0.61–1.24)
GFR, Estimated: 41 mL/min — ABNORMAL LOW (ref >60)
Glucose, Bld: 542 mg/dL (ref 70–99)
Potassium: 4.3 mmol/L (ref 3.5–5.1)
Sodium: 128 mmol/L — ABNORMAL LOW (ref 135–145)
Total Bilirubin: 0.7 mg/dL (ref 0.0–1.2)
Total Protein: 6 g/dL — ABNORMAL LOW (ref 6.5–8.1)

## 2023-12-19 LAB — HEMOGLOBIN A1C
Hgb A1c MFr Bld: 7.4 % — ABNORMAL HIGH (ref 4.8–5.6)
Mean Plasma Glucose: 165.68 mg/dL

## 2023-12-19 LAB — CBC
HCT: 31.4 % — ABNORMAL LOW (ref 39.0–52.0)
Hemoglobin: 10.6 g/dL — ABNORMAL LOW (ref 13.0–17.0)
MCH: 30.2 pg (ref 26.0–34.0)
MCHC: 33.8 g/dL (ref 30.0–36.0)
MCV: 89.5 fL (ref 80.0–100.0)
Platelets: 235 10*3/uL (ref 150–400)
RBC: 3.51 MIL/uL — ABNORMAL LOW (ref 4.22–5.81)
RDW: 14.3 % (ref 11.5–15.5)
WBC: 11.2 10*3/uL — ABNORMAL HIGH (ref 4.0–10.5)
nRBC: 0 % (ref 0.0–0.2)

## 2023-12-19 LAB — RESPIRATORY PANEL BY PCR

## 2023-12-19 LAB — OSMOLALITY: Osmolality: 305 mosm/kg — ABNORMAL HIGH (ref 275–295)

## 2023-12-19 LAB — TSH: TSH: 0.796 u[IU]/mL (ref 0.350–4.500)

## 2023-12-19 MED ORDER — INSULIN ASPART 100 UNIT/ML IJ SOLN
0.0000 [IU] | INTRAMUSCULAR | Status: DC
  Administered 2023-12-19: 9 [IU] via SUBCUTANEOUS
  Filled 2023-12-19: qty 1

## 2023-12-19 MED ORDER — SERTRALINE HCL 50 MG PO TABS
25.0000 mg | ORAL_TABLET | Freq: Every day | ORAL | Status: DC
  Administered 2023-12-19 – 2023-12-23 (×5): 25 mg via ORAL
  Filled 2023-12-19 (×5): qty 1

## 2023-12-19 MED ORDER — INSULIN ASPART 100 UNIT/ML IJ SOLN: 0.0000 [IU] | Freq: Every day | INTRAMUSCULAR | Status: DC

## 2023-12-19 MED ORDER — INSULIN ASPART 100 UNIT/ML IJ SOLN
0.0000 [IU] | INTRAMUSCULAR | Status: DC
  Administered 2023-12-19: 20 [IU] via SUBCUTANEOUS
  Administered 2023-12-19 – 2023-12-20 (×2): 4 [IU] via SUBCUTANEOUS
  Administered 2023-12-20: 3 [IU] via SUBCUTANEOUS
  Administered 2023-12-20: 4 [IU] via SUBCUTANEOUS
  Filled 2023-12-19 (×5): qty 1

## 2023-12-19 MED ORDER — INSULIN ASPART 100 UNIT/ML IJ SOLN: 0.0000 [IU] | Freq: Three times a day (TID) | INTRAMUSCULAR | Status: DC

## 2023-12-19 MED ORDER — ATORVASTATIN CALCIUM 20 MG PO TABS
20.0000 mg | ORAL_TABLET | Freq: Every day | ORAL | Status: DC
  Administered 2023-12-19 – 2023-12-23 (×4): 20 mg via ORAL
  Filled 2023-12-19 (×5): qty 1

## 2023-12-19 MED ORDER — INSULIN ASPART 100 UNIT/ML IJ SOLN
20.0000 [IU] | Freq: Once | INTRAMUSCULAR | Status: AC
Start: 2023-12-19 — End: 2023-12-19
  Administered 2023-12-19: 20 [IU] via SUBCUTANEOUS
  Filled 2023-12-19: qty 1

## 2023-12-19 MED ORDER — HYDROXYZINE HCL 25 MG PO TABS
25.0000 mg | ORAL_TABLET | Freq: Three times a day (TID) | ORAL | Status: DC | PRN
  Administered 2023-12-21 – 2023-12-22 (×2): 25 mg via ORAL
  Filled 2023-12-19 (×2): qty 1

## 2023-12-19 MED ORDER — INSULIN GLARGINE 100 UNIT/ML ~~LOC~~ SOLN
15.0000 [IU] | Freq: Every day | SUBCUTANEOUS | Status: DC
  Administered 2023-12-19: 15 [IU] via SUBCUTANEOUS
  Filled 2023-12-19 (×2): qty 0.15

## 2023-12-19 MED ORDER — DICLOFENAC SODIUM 1 % EX GEL
2.0000 g | Freq: Four times a day (QID) | CUTANEOUS | Status: DC
Start: 2023-12-19 — End: 2023-12-23
  Administered 2023-12-19 – 2023-12-23 (×7): 2 g via TOPICAL
  Filled 2023-12-19 (×2): qty 100

## 2023-12-19 NOTE — Evaluation (Signed)
 Physical Therapy Evaluation Patient Details Name: Wesley Wilson MRN: [id number] DOB: 08/30/1941 Today's Date: 12/19/2023  History of Present Illness  presented to ER secondary to SOB, cough, wheezing; admitted for management of acute hypoxic respiratory failure secondary to CAP, COPD exacerbation.  Clinical Impression  Patient resting in bed upon arrival to room; alert and oriented, talking on cell phone to wife.  Follows commands, agreeable to limited participation with session after min/mod encouragement from therapist.  Of note, cleared for participation with session per MD Denton Lank) despite elevated blood sugars, attributed to steroids/medications; patient fully asymptomatic. Denies pain; does endorse generalized discomfort in bed.  Bilat UE/LE strength and ROM grossly symmetrical and WFL; no focal weakness appreciated.  Able to complete bed mobility with sup/mod indep; sit/stand, standing balance and bed/chair transfer with RW, cga/min assist.  Does require cuing for hand placement, tends to release grasp of RW to reach for armrests; does require UE support for optimal stabilization/safety.  Sats 96-99% on HHFNC with standing and transfer efforts; fair/good tolerance for activity, though patient does endorse generalized fatigue (and declines additional progressive activity at this time).  Will continue to assess/progress in subsequent sessions as appropriate. Would benefit from skilled PT to address above deficits and promote optimal return to PLOF.; recommend post-acute PT follow up as indicated by interdisciplinary care team.         If plan is discharge home, recommend the following: A little help with walking and/or transfers;A little help with bathing/dressing/bathroom   Can travel by private vehicle        Equipment Recommendations Rolling walker (2 wheels)  Recommendations for Other Services       Functional Status Assessment Patient has had a recent decline in their functional  status and demonstrates the ability to make significant improvements in function in a reasonable and predictable amount of time.     Precautions / Restrictions Precautions Precautions: Fall Restrictions Weight Bearing Restrictions Per Provider Order: No      Mobility  Bed Mobility Overal bed mobility: Modified Independent                  Transfers Overall transfer level: Needs assistance Equipment used: Rolling walker (2 wheels) Transfers: Sit to/from Stand, Bed to chair/wheelchair/BSC Sit to Stand: Contact guard assist, Min assist Stand pivot transfers: Contact guard assist, Min assist         General transfer comment: cuing for hand placement, tends to release grasp of RW to reach for armrests; does require UE support for optimal stabilization/safety    Ambulation/Gait               General Gait Details: deferred due to lines/leads in ED space  Stairs            Wheelchair Mobility     Tilt Bed    Modified Rankin (Stroke Patients Only)       Balance Overall balance assessment: Needs assistance Sitting-balance support: No upper extremity supported, Feet supported Sitting balance-Leahy Scale: Good     Standing balance support: Bilateral upper extremity supported Standing balance-Leahy Scale: Poor                               Pertinent Vitals/Pain Pain Assessment Pain Assessment: No/denies pain    Home Living Family/patient expects to be discharged to:: Private residence Living Arrangements: Spouse/significant other Available Help at Discharge: Family;Available PRN/intermittently Type of Home: House Home Access: Ramped entrance  Alternate Level Stairs-Number of Steps: No essential needs on upper level of home Home Layout: Multi-level;Able to live on main level with bedroom/bathroom Home Equipment: Rolling Walker (2 wheels);Rollator (4 wheels);BSC/3in1;Grab bars - toilet;Shower seat      Prior Function Prior  Level of Function : Independent/Modified Independent;Driving;Working/employed             Mobility Comments: Indep with ADLs, household and community mobilization; active, still working on farm in limited capacities.  Denies fall history; does endorse recent use of RW over past couple days due to current illness.       Extremity/Trunk Assessment   Upper Extremity Assessment Upper Extremity Assessment: Overall WFL for tasks assessed    Lower Extremity Assessment Lower Extremity Assessment: Overall WFL for tasks assessed (grossly at least 4/5 throughout)       Communication   Communication Communication: No apparent difficulties    Cognition Arousal: Alert Behavior During Therapy: WFL for tasks assessed/performed   PT - Cognitive impairments: No apparent impairments                                 Cueing       General Comments      Exercises     Assessment/Plan    PT Assessment Patient needs continued PT services  PT Problem List Decreased activity tolerance;Decreased balance;Decreased mobility;Cardiopulmonary status limiting activity;Decreased safety awareness;Decreased knowledge of use of DME       PT Treatment Interventions DME instruction;Gait training;Functional mobility training;Therapeutic activities;Therapeutic exercise;Balance training;Patient/family education    PT Goals (Current goals can be found in the Care Plan section)  Acute Rehab PT Goals Patient Stated Goal: to return home PT Goal Formulation: With patient Time For Goal Achievement: 01/02/24 Potential to Achieve Goals: Good    Frequency Min 2X/week     Co-evaluation               AM-PAC PT "6 Clicks" Mobility  Outcome Measure Help needed turning from your back to your side while in a flat bed without using bedrails?: None Help needed moving from lying on your back to sitting on the side of a flat bed without using bedrails?: None Help needed moving to and from a  bed to a chair (including a wheelchair)?: A Little Help needed standing up from a chair using your arms (e.g., wheelchair or bedside chair)?: A Little Help needed to walk in hospital room?: A Little Help needed climbing 3-5 steps with a railing? : A Little 6 Click Score: 20    End of Session   Activity Tolerance: Patient tolerated treatment well Patient left: in chair;with call bell/phone within reach Nurse Communication: Mobility status PT Visit Diagnosis: Muscle weakness (generalized) (M62.81);Difficulty in walking, not elsewhere classified (R26.2)    Time: 4696-2952 PT Time Calculation (min) (ACUTE ONLY): 20 min   Charges:   PT Evaluation $PT Eval Moderate Complexity: 1 Mod   PT General Charges $$ ACUTE PT VISIT: 1 Visit       Peytyn Trine H. Manson Passey, PT, DPT, NCS 12/19/23, 4:13 PM [phone]

## 2023-12-19 NOTE — ED Notes (Addendum)
 Pt alerted RN that he is having visual hallucinations that began this morning. Per the pt he is seeing people that are not actually there. RN asked pt how is sleeping has been. Pt responds and tells Clinical research associate he has not been sleeping well the last 2-3 days. Per the pt he has not experienced this before. Writer updated Valente David MD of this finding.

## 2023-12-19 NOTE — Progress Notes (Addendum)
 Progress Note   [name]:[id number] DOB: [birth year] DOA: 12/18/2023     1 DOS: the patient was seen and examined on 12/19/2023   Brief hospital course:  HPI on admission 12/18/23: "KELWIN GIBLER is a 83 y.o. male with medical history significant of type 2 diabetes, hypertension, stage III CKD, COPD, pacemaker placement presenting with acute respiratory failure with hypoxia, pneumonia, COPD exacerbation, encephalopathy.  History primarily from family in the setting of lethargy/encephalopathy.  Per report, family reports increased work of breathing, shortness of breath, cough wheezing over the past 2 to 3 days. ..." See H&P for full HPI on admission & ED course.  Pt was admitted with acute respiratory failure with hypoxia due to COPD with acute exacerbation and multi-focal pneumonia.  Further hospital course and management as outlined below.   Assessment and Plan:  Acute hypoxemic respiratory failure  COPD with Acute Exacerbation  Multifocal Community-acquired Pneumonia Severe sepsis - POA as evidenced by tachycardia, tachypnea, acute hypoxic respiratory failure consistent with organ dysfunction. Normal lactate. Uses intermittent home O2 at baseline. 3/10 -- still requiring heated HF oxygen at 50 L/min, 65% FiO2 --Continue IV Rocephin and Zithromax --Continue IV Solu-medrol  --Duonebs  --Follow blood and sputum cultures  --Supplemental O2 prn - target sats 88-94%, wean as toelrated --Pulmonary hygiene  --Monitor respiratory status closely, fever curve, CBC  Acute metabolic encephalopathy  Multifactorial due to sepsis, PNA, hypoxia. VBG stable  --Mgmt of underlying issues as outlined --Delirium precautions  Hyponatremia - Na 131 on admission >> 128 today. Suspect hypovolemic in setting of  --Check serum osm, urine osm & urine Na, TSH, AM cortisol --Monitor BMP  Type 2 diabetes mellitus, uncontrolled Last Hbg A1c 8.9% 3/10 AM -- glucose above 500 --Repeat  A1c pending --Novolog 20 units x 1 plus sliding scale --Start Semglee 15 units daily --Increase sliding scale to resistant 0-20 units Q4H for now --Titrate Regimen --Anticipate steroid-induced hyperglycemia  Chronic HFpEF - Echo from July 2024 with normal EF, grade II DD, mild-mod MR Clinically euvolemic to dry  --Monitor volume status, renal function, electrolytes --Continue metoprolol --Appears not on diuretics  History of ETOH abuse Remote hx/o ETOH- no active use for multiple decades per family  --Resumed on home Librium   Elevated troponin / Myocardial injury - demand ischemia, Troponin 30-->29 No active chest pain.  EKG stable Due to decompensated respiratory failure, COPD, pneumonia --Repeat EKG and troponin if chest pain  CKD (chronic kidney disease) stage 3a --Monitor BMP   History of cardiac pacemaker No acute issues. Monitor         Subjective: Pt seen in ED holding for a bed with sister at bedside this AM.  He reports feeling little bit better since admission.  Feels quite anxious.  No chest pain.  No fever/chills.    Physical Exam: Vitals:   12/19/23 0730 12/19/23 0758 12/19/23 1200 12/19/23 1242  BP:  120/75  137/77  Pulse: 80 81 88 88  Resp: 19 (!) 22 (!) 22 (!) 22  Temp:      TempSrc:      SpO2: 96% 95% 97% 98%  Weight:      Height:       General exam: awake, alert, no acute distress, ill-appearing HEENT: HHF nasal cannula in place, moist mucus membranes, hearing grossly normal  Respiratory system: decreaesed breath sounds with diffuse high-pitched expiratory wheezes, normal respiratory effort at rest, on HHF O2 at 50 L/min 65%  FiO2. Cardiovascular system: normal S1/S2,  RRR, no JVD, murmurs, rubs, gallops, no pedal edema.   Gastrointestinal system: soft, NT, ND, no HSM felt, +bowel sounds. Central nervous system: A&O x 3. no gross focal neurologic deficits, normal speech Skin: dry, intact, normal temperature Psychiatry: normal mood, congruent  affect   Data Reviewed:  Notable labs --  Na 131 >> 128 Bicarb 21 GLUCOSE 542 BUN 49 Cr 1.67 Albumin 2.5 WBC 11.2 Hbg 10.6  Strep pneumo  ag - negative RVP - negative  Family Communication: sister at bedside on rounds  Disposition: Status is: Inpatient Remains inpatient appropriate because: on IV therapies and high flow oxygen   Planned Discharge Destination: Home    Time spent: 52 minutes including time at bedside and in coordination of care with staff and consultants.  Author: Pennie Banter, DO 12/19/2023 2:45 PM  For on call review [email or web address].

## 2023-12-19 NOTE — ED Notes (Signed)
 This RN received report from Georgann Housekeeper RN and performed bedside care handoff. This RN introduced self to pt. Call light in reach, bed wheels locked, side rail raised, pt updated on plan of care. Rounding completed.

## 2023-12-19 NOTE — Progress Notes (Signed)
 OT Cancellation Note  Patient Details Name: Wesley Wilson MRN: [id number] DOB: November 02, 1940   Cancelled Treatment:    Reason Eval/Treat Not Completed: Medical issues which prohibited therapy. Orders received, pt with elevated glucose levels of 542. OT will hold therapy eval at this time until medically appropriate.   Allex Lapoint L. Jakia Kennebrew, OTR/L  12/19/23, 9:30 AM

## 2023-12-19 NOTE — ED Notes (Signed)
 Pt c/o anxiety and not being able to rest. SEE MAR.  Float RN

## 2023-12-19 NOTE — Inpatient Diabetes Management (Signed)
 Inpatient Diabetes Program Recommendations  AACE/ADA: New Consensus Statement on Inpatient Glycemic Control (2015)  Target Ranges:  Prepandial:   less than 140 mg/dL      Peak postprandial:   less than 180 mg/dL (1-2 hours)      Critically ill patients:  140 - 180 mg/dL   Lab Results  Component Value Date   GLUCAP 259 (H) 05/23/2023   HGBA1C 8.9 (H) 05/02/2023    Review of Glycemic Control  Diabetes history: DM2 Outpatient Diabetes medications: Trulicity 4.5 mg weekly No taking Jardiance daily Current orders for Inpatient glycemic control: Novolog 0-9 units tid, 0-5 units hs Prednisone 40 mg daily Received Solumedrol 80 mg x one dose yesterday afternoon   Inpatient Diabetes Program Recommendations:   Please consider while on steroids: -Change Novolog correction to 0-9 units q 4 hrs.  Thank you, Billy Fischer. Bentlie Catanzaro, RN, MSN, CDCES  Diabetes Coordinator Inpatient Glycemic Control Team Team Pager [phone] (8am-5pm) 12/19/2023 10:49 AM

## 2023-12-20 DIAGNOSIS — J9601 Acute respiratory failure with hypoxia: Secondary | ICD-10-CM | POA: Diagnosis not present

## 2023-12-20 LAB — CBC
HCT: 32.6 % — ABNORMAL LOW (ref 39.0–52.0)
Hemoglobin: 10.8 g/dL — ABNORMAL LOW (ref 13.0–17.0)
MCH: 30.1 pg (ref 26.0–34.0)
MCHC: 33.1 g/dL (ref 30.0–36.0)
MCV: 90.8 fL (ref 80.0–100.0)
Platelets: 313 10*3/uL (ref 150–400)
RBC: 3.59 MIL/uL — ABNORMAL LOW (ref 4.22–5.81)
RDW: 14.2 % (ref 11.5–15.5)
WBC: 15.1 10*3/uL — ABNORMAL HIGH (ref 4.0–10.5)
nRBC: 0 % (ref 0.0–0.2)

## 2023-12-20 LAB — BASIC METABOLIC PANEL
Anion gap: 8 (ref 5–15)
BUN: 44 mg/dL — ABNORMAL HIGH (ref 8–23)
CO2: 23 mmol/L (ref 22–32)
Calcium: 8.2 mg/dL — ABNORMAL LOW (ref 8.9–10.3)
Chloride: 105 mmol/L (ref 98–111)
Creatinine, Ser: 1.31 mg/dL — ABNORMAL HIGH (ref 0.61–1.24)
GFR, Estimated: 55 mL/min — ABNORMAL LOW (ref >60)
Glucose, Bld: 188 mg/dL — ABNORMAL HIGH (ref 70–99)
Potassium: 4.4 mmol/L (ref 3.5–5.1)
Sodium: 136 mmol/L (ref 135–145)

## 2023-12-20 LAB — GLUCOSE, CAPILLARY
Glucose-Capillary: 309 mg/dL — ABNORMAL HIGH (ref 70–99)
Glucose-Capillary: 331 mg/dL — ABNORMAL HIGH (ref 70–99)
Glucose-Capillary: 356 mg/dL — ABNORMAL HIGH (ref 70–99)

## 2023-12-20 LAB — CBG MONITORING, ED
Glucose-Capillary: 200 mg/dL — ABNORMAL HIGH (ref 70–99)
Glucose-Capillary: 162 mg/dL — ABNORMAL HIGH (ref 70–99)
Glucose-Capillary: 144 mg/dL — ABNORMAL HIGH (ref 70–99)

## 2023-12-20 LAB — SODIUM, URINE, RANDOM: Sodium, Ur: 43 mmol/L

## 2023-12-20 LAB — CORTISOL-AM, BLOOD: Cortisol - AM: 3 ug/dL — ABNORMAL LOW (ref 6.7–22.6)

## 2023-12-20 LAB — OSMOLALITY, URINE: Osmolality, Ur: 836 mosm/kg (ref 300–900)

## 2023-12-20 LAB — MAGNESIUM: Magnesium: 2.8 mg/dL — ABNORMAL HIGH (ref 1.7–2.4)

## 2023-12-20 MED ORDER — DM-GUAIFENESIN ER 30-600 MG PO TB12
1.0000 | ORAL_TABLET | Freq: Two times a day (BID) | ORAL | Status: DC
  Administered 2023-12-20 – 2023-12-23 (×6): 1 via ORAL
  Filled 2023-12-20 (×6): qty 1

## 2023-12-20 MED ORDER — INSULIN ASPART 100 UNIT/ML IJ SOLN
0.0000 [IU] | Freq: Every day | INTRAMUSCULAR | Status: DC
  Administered 2023-12-20: 4 [IU] via SUBCUTANEOUS
  Filled 2023-12-20: qty 1

## 2023-12-20 MED ORDER — INSULIN ASPART 100 UNIT/ML IJ SOLN: 0.0000 [IU] | Freq: Three times a day (TID) | INTRAMUSCULAR | Status: DC

## 2023-12-20 MED ORDER — INSULIN ASPART 100 UNIT/ML IJ SOLN
3.0000 [IU] | Freq: Three times a day (TID) | INTRAMUSCULAR | Status: DC
  Administered 2023-12-20 – 2023-12-23 (×9): 3 [IU] via SUBCUTANEOUS
  Filled 2023-12-20 (×9): qty 1

## 2023-12-20 MED ORDER — INSULIN GLARGINE 100 UNIT/ML ~~LOC~~ SOLN
18.0000 [IU] | Freq: Every day | SUBCUTANEOUS | Status: DC
  Administered 2023-12-21 – 2023-12-22 (×2): 18 [IU] via SUBCUTANEOUS
  Filled 2023-12-20 (×3): qty 0.18

## 2023-12-20 NOTE — ED Notes (Signed)
 The pt is an 82 yom lying supine in his bed sleeping. Pt does have his high flow O2 on.

## 2023-12-20 NOTE — Progress Notes (Signed)
 Progress Note   [name] JYN:[id number] DOB: Feb 09, 1941 DOA: 12/18/2023     2 DOS: the patient was seen and examined on 12/20/2023   Brief hospital course:  HPI on admission 12/18/23: "Wesley Wilson is a 83 y.o. male with medical history significant of type 2 diabetes, hypertension, stage III CKD, COPD, pacemaker placement presenting with acute respiratory failure with hypoxia, pneumonia, COPD exacerbation, encephalopathy.  History primarily from family in the setting of lethargy/encephalopathy.  Per report, family reports increased work of breathing, shortness of breath, cough wheezing over the past 2 to 3 days. ..." See H&P for full HPI on admission & ED course.  Pt was admitted with acute respiratory failure with hypoxia due to COPD with acute exacerbation and multi-focal pneumonia.  Further hospital course and management as outlined below.   Assessment and Plan:  Acute hypoxemic respiratory failure  COPD with Acute Exacerbation  Multifocal Community-acquired Pneumonia Severe sepsis - POA as evidenced by tachycardia, tachypnea, acute hypoxic respiratory failure consistent with organ dysfunction. Normal lactate. Uses intermittent home O2 at baseline. 3/10 -- still requiring heated HF oxygen at 50 L/min, 65% FiO2 --Continue IV Rocephin and Zithromax --IV Solu-medrol >> Prednisone 40 mg daily --Duonebs scheduled, PRN albuterol nebs, Mucinex --Follow blood and sputum cultures  --Supplemental O2 prn - target sats 88-94%, wean as toelrated --Pulmonary hygiene  --Monitor respiratory status closely, fever curve, CBC  Acute metabolic encephalopathy  Multifactorial due to sepsis, PNA, hypoxia. VBG stable  --Mgmt of underlying issues as outlined --Delirium precautions  Hyponatremia - Na 131 on admission >> 128 today. Suspect hypovolemic in setting of  --Check serum osm, urine osm & urine Na, TSH, AM cortisol --Monitor BMP  Type 2 diabetes mellitus, uncontrolled Last  Hbg A1c 8.9% 3/10 AM -- glucose above 500 --Repeat A1c pending --Add Novolog 3 units TID, continue sliding scale --Increase Semglee 15 >> 18 units daily --Titrate Regimen --Anticipate steroid-induced hyperglycemia  Chronic HFpEF - Echo from July 2024 with normal EF, grade II DD, mild-mod MR Clinically euvolemic to dry  --Monitor volume status, renal function, electrolytes --Continue metoprolol --Appears not on diuretics  History of ETOH abuse Remote hx/o ETOH- no active use for multiple decades per family  --Resumed on home Librium   Elevated troponin / Myocardial injury - demand ischemia, Troponin 30-->29 No active chest pain.  EKG stable Due to decompensated respiratory failure, COPD, pneumonia --Repeat EKG and troponin if chest pain  CKD (chronic kidney disease) stage 3a --Monitor BMP   History of cardiac pacemaker No acute issues. Monitor         Subjective: Pt seen in ED holding for a bed this AM. Reports he feels better, breathing feels somewhat improved.  Denies other complaints.  Physical Exam: Vitals:   12/20/23 1149 12/20/23 1443 12/20/23 1448 12/20/23 1453  BP: (!) 150/78 (!) 170/98    Pulse: 82 85    Resp: 20 20    Temp: 97.6 F (36.4 C) 98.1 F (36.7 C)    TempSrc: Oral Oral    SpO2: 94% 94% 94% 93%  Weight:      Height:       General exam: awake, alert, no acute distress, ill-appearing HEENT: HHF nasal cannula in place, moist mucus membranes, hearing grossly normal  Respiratory system: decreaesed breath sounds with slightly improved expiratory wheezes, normal respiratory effort at rest, on HHF O2 at 40 L/min 50%  FiO2. Cardiovascular system: normal S1/S2,  RRR Gastrointestinal system: soft, NT, ND, no HSM felt, +  bowel sounds. Central nervous system: A&O x 3. no gross focal neurologic deficits, normal speech Skin: dry, intact, normal temperature Psychiatry: normal mood, congruent affect   Data Reviewed:  Notable labs --  Na 131 >> 128 >>  136 today Glucose 188 BUN 49 >> 44 Cr 1.67 >> 1.31  WBC 11.2 >> 15.1 (on steroids) Hbg 10.6 >> 10.8 stable   Strep pneumo  ag - negative RVP - negative  Family Communication: sister at bedside on rounds 3/10   Disposition: Status is: Inpatient Remains inpatient appropriate because: on IV therapies and high flow oxygen   Planned Discharge Destination: Home    Time spent: 42 minutes   Author: Pennie Banter, DO 12/20/2023 4:55 PM  For on call review [email or web address].

## 2023-12-20 NOTE — ED Notes (Signed)
 This RN gave report to Marva Panda Paramedic and performed bedside care handoff. Call light in reach, bed wheels locked, side rail raised, pt updated on plan of care. Rounding completed.

## 2023-12-20 NOTE — Progress Notes (Addendum)
 PT Cancellation Note  Patient Details Name: Wesley Wilson MRN: [id number] DOB: [birth year]   Cancelled Treatment:     PT attempt. Pt awake, in side lying on HHFNC. Pt politely requested [name] return after lunch." I did not sleep any last night." As requested, PT will return at a later time.    PT attempt, 2nd attempt. Pt just worked with OT and now eating lunch. Pt requested [name] return tomorrow morning. Acute PT will continue to follow per current POC.   [name] 12/20/2023, 8:29 AM

## 2023-12-20 NOTE — Inpatient Diabetes Management (Signed)
 Inpatient Diabetes Program Recommendations  AACE/ADA: New Consensus Statement on Inpatient Glycemic Control   Target Ranges:  Prepandial:   less than 140 mg/dL      Peak postprandial:   less than 180 mg/dL (1-2 hours)      Critically ill patients:  140 - 180 mg/dL    Latest Reference Range & Units 12/20/23 01:57 12/20/23 04:50  Glucose-Capillary 70 - 99 mg/dL 401 (H) 027 (H)    Latest Reference Range & Units 12/19/23 12:47 12/19/23 14:11 12/19/23 16:09 12/19/23 20:00  Glucose-Capillary 70 - 99 mg/dL 253 (HH) 664 (H) 403 (H) 193 (H)   Review of Glycemic Control  Diabetes history: DM2 Outpatient Diabetes medications: Trulicity 4.5 mg Qweek Current orders for Inpatient glycemic control: Lantus 15 units daily, Novolog 0-20 units Q4H; Prednisone 40 mg QAM  Inpatient Diabetes Program Recommendations:    Insulin: If steroids are continued, please consider increasing Lantus to 18 units daily and ordering Novolog 3 units TID with meals for meal coverage if patient eats at least 50% of meals.   Thanks, Orlando Penner, RN, MSN, CDCES Diabetes Coordinator Inpatient Diabetes Program [phone] (Team Pager from 8am to 5pm)

## 2023-12-20 NOTE — Evaluation (Signed)
 Occupational Therapy Evaluation Patient Details Name: Wesley Wilson MRN: [id number] DOB: 08/16/1941 Today's Date: 12/20/2023   History of Present Illness   presented to ER secondary to SOB, cough, wheezing; admitted for management of acute hypoxic respiratory failure secondary to CAP, COPD exacerbation.     Clinical Impressions Chart reviewed to date, pt greeted in room, agreeable to OT evaluation. Pt is alert and oriented x4. PTA pt reports he is MOD I in ADL, amb with no AD however recent use of RW in the last few days due to weakness. Pt presents with deficits in endurance and activity tolerance, affecting safe and optimal ADL completion. Bed mobility completed with supervision, STS with CGA, short amb transfers with RW with CGA. MAX A required for LB dressing, SET UP for grooming/feeding tasks. Pt is performing ADL below PLOF, pt will continue to benefit from acute OT to address deficits and to facilitate optimal ADL performance.      If plan is discharge home, recommend the following:   A little help with walking and/or transfers;A little help with bathing/dressing/bathroom;Assist for transportation;Help with stairs or ramp for entrance;Assistance with cooking/housework     Functional Status Assessment   Patient has had a recent decline in their functional status and demonstrates the ability to make significant improvements in function in a reasonable and predictable amount of time.     Equipment Recommendations   BSC/3in1     Recommendations for Other Services         Precautions/Restrictions   Precautions Precautions: Fall Recall of Precautions/Restrictions: Intact Restrictions Weight Bearing Restrictions Per Provider Order: No     Mobility Bed Mobility Overal bed mobility: Needs Assistance Bed Mobility: Supine to Sit     Supine to sit: Supervision, HOB elevated          Transfers Overall transfer level: Needs assistance Equipment used: Rolling  walker (2 wheels) Transfers: Sit to/from Stand Sit to Stand: Contact guard assist           General transfer comment: intermittent vcs for technique      Balance Overall balance assessment: Needs assistance Sitting-balance support: No upper extremity supported, Feet supported Sitting balance-Leahy Scale: Good     Standing balance support: Bilateral upper extremity supported Standing balance-Leahy Scale: Fair                             ADL either performed or assessed with clinical judgement   ADL Overall ADL's : Needs assistance/impaired Eating/Feeding: Set up;Sitting   Grooming: Wash/dry face;Sitting;Set up               Lower Body Dressing: Moderate assistance   Toilet Transfer: Contact guard assist;Rolling walker (2 wheels) Toilet Transfer Details (indicate cue type and reason): simulated to bedside chair                 Vision Patient Visual Report: No change from baseline       Perception         Praxis         Pertinent Vitals/Pain Pain Assessment Pain Assessment: No/denies pain     Extremity/Trunk Assessment Upper Extremity Assessment Upper Extremity Assessment: Overall WFL for tasks assessed   Lower Extremity Assessment Lower Extremity Assessment: Overall WFL for tasks assessed       Communication Communication Communication: No apparent difficulties   Cognition Arousal: Alert Behavior During Therapy: WFL for tasks assessed/performed Cognition: No apparent impairments  Following commands: Intact       Cueing  General Comments   Cueing Techniques: Verbal cues  respiratory in room to place pt on HFNC, pt >90% on 8L throughout   Exercises Other Exercises Other Exercises: edu re: role of OT, role of rehab, discharge recommendations, home safety, falls prevention, DME use for safe ADLs   Shoulder Instructions      Home Living Family/patient expects to be discharged  to:: Private residence Living Arrangements: Spouse/significant other Available Help at Discharge: Family;Available PRN/intermittently (has an aid for his wife, family that will assist per his report) Type of Home: House Home Access: Ramped entrance     Home Layout: Multi-level;Able to live on main level with bedroom/bathroom     Bathroom Shower/Tub: Producer, television/film/video: Standard Bathroom Accessibility: Yes   Home Equipment: Agricultural consultant (2 wheels);Rollator (4 wheels);BSC/3in1;Grab bars - toilet;Shower seat          Prior Functioning/Environment Prior Level of Function : Independent/Modified Independent;Driving;Working/employed             Mobility Comments: amb with no AD, PRN use of RW ADLs Comments: generally MOD I-I in ADL/IADL, working on [employer] still    OT Problem List: Decreased strength;Impaired balance (sitting and/or standing);Decreased activity tolerance;Decreased knowledge of use of DME or AE   OT Treatment/Interventions: Self-care/ADL training;DME and/or AE instruction;Therapeutic activities;Balance training;Therapeutic exercise;Patient/family education;Energy conservation      OT Goals(Current goals can be found in the care plan section)   Acute Rehab OT Goals Patient Stated Goal: go home OT Goal Formulation: With patient Time For Goal Achievement: 01/03/24 Potential to Achieve Goals: Good ADL Goals Pt Will Perform Grooming: with modified independence;sitting Pt Will Perform Lower Body Dressing: with modified independence;sitting/lateral leans;sit to/from stand Pt Will Transfer to Toilet: with modified independence;ambulating Pt Will Perform Toileting - Clothing Manipulation and hygiene: with modified independence;sitting/lateral leans;sit to/from stand   OT Frequency:  Min 1X/week    Co-evaluation              AM-PAC OT "6 Clicks" Daily Activity     Outcome Measure Help from another person eating meals?:  None Help from another person taking care of personal grooming?: None Help from another person toileting, which includes using toliet, bedpan, or urinal?: A Little Help from another person bathing (including washing, rinsing, drying)?: None Help from another person to put on and taking off regular upper body clothing?: None Help from another person to put on and taking off regular lower body clothing?: A Little 6 Click Score: 22   End of Session Equipment Utilized During Treatment: Oxygen;Rolling walker (2 wheels) Nurse Communication: Mobility status  Activity Tolerance: Patient tolerated treatment well Patient left: in chair;with call bell/phone within reach;with chair alarm set  OT Visit Diagnosis: Other abnormalities of gait and mobility (R26.89);Muscle weakness (generalized) (M62.81);Unsteadiness on feet (R26.81)                Time: 4098-1191 OT Time Calculation (min): 22 min Charges:  OT General Charges $OT Visit: 1 Visit OT Evaluation $OT Eval Moderate Complexity: 1 Mod  Oleta Mouse, OTD OTR/L  12/20/23, 4:22 PM

## 2023-12-21 DIAGNOSIS — N189 Chronic kidney disease, unspecified: Secondary | ICD-10-CM

## 2023-12-21 DIAGNOSIS — A419 Sepsis, unspecified organism: Secondary | ICD-10-CM

## 2023-12-21 DIAGNOSIS — N179 Acute kidney failure, unspecified: Secondary | ICD-10-CM

## 2023-12-21 DIAGNOSIS — J189 Pneumonia, unspecified organism: Secondary | ICD-10-CM | POA: Diagnosis not present

## 2023-12-21 DIAGNOSIS — R6520 Severe sepsis without septic shock: Secondary | ICD-10-CM

## 2023-12-21 DIAGNOSIS — I5A Non-ischemic myocardial injury (non-traumatic): Secondary | ICD-10-CM | POA: Insufficient documentation

## 2023-12-21 DIAGNOSIS — R339 Retention of urine, unspecified: Secondary | ICD-10-CM

## 2023-12-21 DIAGNOSIS — F1011 Alcohol abuse, in remission: Secondary | ICD-10-CM

## 2023-12-21 DIAGNOSIS — J9621 Acute and chronic respiratory failure with hypoxia: Secondary | ICD-10-CM | POA: Diagnosis not present

## 2023-12-21 DIAGNOSIS — J441 Chronic obstructive pulmonary disease with (acute) exacerbation: Secondary | ICD-10-CM | POA: Diagnosis not present

## 2023-12-21 DIAGNOSIS — G9341 Metabolic encephalopathy: Secondary | ICD-10-CM | POA: Insufficient documentation

## 2023-12-21 LAB — BASIC METABOLIC PANEL
Anion gap: 7 (ref 5–15)
BUN: 37 mg/dL — ABNORMAL HIGH (ref 8–23)
CO2: 25 mmol/L (ref 22–32)
Calcium: 8.4 mg/dL — ABNORMAL LOW (ref 8.9–10.3)
Chloride: 106 mmol/L (ref 98–111)
Creatinine, Ser: 1.05 mg/dL (ref 0.61–1.24)
GFR, Estimated: >60 mL/min (ref >60)
Glucose, Bld: 152 mg/dL — ABNORMAL HIGH (ref 70–99)
Potassium: 4.5 mmol/L (ref 3.5–5.1)
Sodium: 138 mmol/L (ref 135–145)

## 2023-12-21 LAB — GLUCOSE, CAPILLARY
Glucose-Capillary: 149 mg/dL — ABNORMAL HIGH (ref 70–99)
Glucose-Capillary: 88 mg/dL (ref 70–99)
Glucose-Capillary: 187 mg/dL — ABNORMAL HIGH (ref 70–99)
Glucose-Capillary: 276 mg/dL — ABNORMAL HIGH (ref 70–99)
Glucose-Capillary: 263 mg/dL — ABNORMAL HIGH (ref 70–99)

## 2023-12-21 LAB — CBC
HCT: 34.1 % — ABNORMAL LOW (ref 39.0–52.0)
Hemoglobin: 11.5 g/dL — ABNORMAL LOW (ref 13.0–17.0)
MCH: 30.1 pg (ref 26.0–34.0)
MCHC: 33.7 g/dL (ref 30.0–36.0)
MCV: 89.3 fL (ref 80.0–100.0)
Platelets: 384 10*3/uL (ref 150–400)
RBC: 3.82 MIL/uL — ABNORMAL LOW (ref 4.22–5.81)
RDW: 14.6 % (ref 11.5–15.5)
WBC: 12 10*3/uL — ABNORMAL HIGH (ref 4.0–10.5)
nRBC: 0 % (ref 0.0–0.2)

## 2023-12-21 MED ORDER — TAMSULOSIN HCL 0.4 MG PO CAPS
0.4000 mg | ORAL_CAPSULE | Freq: Every day | ORAL | Status: DC
  Administered 2023-12-21 – 2023-12-22 (×2): 0.4 mg via ORAL
  Filled 2023-12-21 (×2): qty 1

## 2023-12-21 MED ORDER — INSULIN ASPART 100 UNIT/ML IJ SOLN
0.0000 [IU] | INTRAMUSCULAR | Status: DC
  Administered 2023-12-21: 3 [IU] via SUBCUTANEOUS
  Administered 2023-12-21: 11 [IU] via SUBCUTANEOUS
  Administered 2023-12-21: 4 [IU] via SUBCUTANEOUS
  Administered 2023-12-21: 11 [IU] via SUBCUTANEOUS
  Administered 2023-12-21: 15 [IU] via SUBCUTANEOUS
  Administered 2023-12-22 (×2): 3 [IU] via SUBCUTANEOUS
  Filled 2023-12-21 (×7): qty 1

## 2023-12-21 NOTE — Assessment & Plan Note (Signed)
 Patient stated he restarted Flomax.  Patient also on finasteride

## 2023-12-21 NOTE — Plan of Care (Signed)
  Problem: Education: Goal: Ability to describe self-care measures that may prevent or decrease complications (Diabetes Survival Skills Education) will improve Outcome: Progressing Goal: Individualized Educational Video(s) Outcome: Progressing   Problem: Coping: Goal: Ability to adjust to condition or change in health will improve Outcome: Progressing   Problem: Metabolic: Goal: Ability to maintain appropriate glucose levels will improve Outcome: Progressing   Problem: Nutritional: Goal: Maintenance of adequate nutrition will improve Outcome: Progressing   Problem: Clinical Measurements: Goal: Ability to maintain clinical measurements within normal limits will improve Outcome: Progressing Goal: Diagnostic test results will improve Outcome: Progressing Goal: Respiratory complications will improve Outcome: Progressing   Problem: Coping: Goal: Level of anxiety will decrease Outcome: Progressing   Problem: Education: Goal: Knowledge of disease or condition will improve Outcome: Progressing Goal: Knowledge of the prescribed therapeutic regimen will improve Outcome: Progressing   Problem: Respiratory: Goal: Ability to maintain a clear airway will improve Outcome: Progressing Goal: Levels of oxygenation will improve Outcome: Progressing Goal: Ability to maintain adequate ventilation will improve Outcome: Progressing

## 2023-12-21 NOTE — Assessment & Plan Note (Signed)
 Secondary to pneumonia and acute respiratory failure.

## 2023-12-21 NOTE — TOC Initial Note (Signed)
 Transition of Care [facility]) - Initial/Assessment Note    Patient Details  Name: Wesley Wilson MRN: [id number] Date of Birth: [birth year]  [facility] CM/SW Contact:    Truddie Hidden, RN Phone Number: 12/21/2023, 12:20 PM  Clinical Narrative:                 Per message received for PT. Recommendation for PT is for STR at SNF. Patient refused SNF. He has requested to contact his sister regarding discharge plans.  Attempt to reach patient's sister, Amil Amen. No answer. Left a message.           Patient Goals and CMS Choice            Expected Discharge Plan and Services                                              Prior Living Arrangements/Services                       Activities of Daily Living   ADL Screening (condition at time of admission) Independently performs ADLs?: Yes (appropriate for developmental age) Is the patient deaf or have difficulty hearing?: No Does the patient have difficulty seeing, even when wearing glasses/contacts?: No Does the patient have difficulty concentrating, remembering, or making decisions?: No  Permission Sought/Granted                  Emotional Assessment              Admission diagnosis:  Respiratory distress [R06.03] COPD exacerbation (HCC) [J44.1] Acute respiratory failure with hypoxia (HCC) [J96.01] Community acquired pneumonia of right lung, unspecified part of lung [J18.9] Patient Active Problem List   Diagnosis Date Noted   Acute respiratory failure with hypoxia (HCC) 12/18/2023   Encephalopathy 12/18/2023   CKD (chronic kidney disease) stage 3, GFR 30-59 ml/min (HCC) 12/18/2023   Elevated troponin 12/18/2023   AKI (acute kidney injury) (HCC) 05/20/2023   UTI (urinary tract infection) 05/20/2023   History of cardiac pacemaker 05/01/2023   NSVT (nonsustained ventricular tachycardia) (HCC) 05/01/2023   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 04/22/23 05/01/2023   Urinary  retention 05/01/2023   BPH (benign prostatic hyperplasia) 05/01/2023   Pneumonia due to infectious organism 05/01/2023   Chronic anemia 05/01/2023   Chronic obstructive pulmonary disease (COPD) (HCC) 05/01/2023   Generalized weakness 05/01/2023   Acute hypoxemic respiratory failure (HCC) 04/23/2023   History of ETOH abuse 11/17/2020   Hypertension 11/17/2020   Type 2 diabetes mellitus, without long-term current use of insulin (HCC) 11/17/2020   PCP:  Dortha Kern, MD Pharmacy:   [facility] [address] [place] [address] [place] [address] Phone: [phone] Fax: [phone]     Social Drivers of Health (SDOH) Social History: SDOH Screenings   Food Insecurity: No Food Insecurity (12/20/2023)  Housing: Low Risk  (12/21/2023)  Transportation Needs: No Transportation Needs (12/20/2023)  Utilities: Not At Risk (12/21/2023)  Financial Resource Strain: Low Risk  (04/24/2023)   Received from [facility]  Social Connections: Moderately Integrated (12/20/2023)  Tobacco Use: Medium Risk (12/18/2023)   SDOH Interventions:     Readmission Risk Interventions     No data to display

## 2023-12-21 NOTE — Assessment & Plan Note (Signed)
 Present on admission with tachycardia and tachypnea, acute respiratory failure and acute kidney injury acute metabolic encephalopathy and multifocal pneumonia.

## 2023-12-21 NOTE — Assessment & Plan Note (Signed)
 Will continue a prednisone taper for few more days upon going home.

## 2023-12-21 NOTE — Progress Notes (Signed)
 CCMD called, patient had 11 beats of Vtach, patient asymptomatic, no complain of chest pain or palpitations. Webb Silversmith NP made aware. No new order.

## 2023-12-21 NOTE — Progress Notes (Signed)
 Progress Note   [name] ZOX:[id number] DOB: 16-May-1941 DOA: 12/18/2023     3 DOS: the patient was seen and examined on 12/21/2023   Brief hospital course: 83 y.o. male with medical history significant of type 2 diabetes, hypertension, stage III CKD, COPD, pacemaker placement presenting with acute respiratory failure with hypoxia, pneumonia, COPD exacerbation, encephalopathy.  History primarily from family in the setting of lethargy/encephalopathy.  Per report, family reports increased work of breathing, shortness of breath, cough wheezing over the past 2 to 3 days. ..." See H&P for full HPI on admission & ED course.   Pt was admitted with acute respiratory failure with hypoxia due to COPD with acute exacerbation and multi-focal pneumonia.  3/12.  Patient was tapered down to 4 L of oxygen.  With physical therapy on 5 L he did drop down to 81% pulse ox with ambulation.  Assessment and Plan: * Acute on chronic hypoxic respiratory failure (HCC) Patient states he has oxygen at home and weaned himself off in the past.  Patient tapered from high flow nasal cannula 7 L this morning down to 4 L.  With working with physical therapy on 5 L did desaturate to 81% with ambulation.  Continue to check pulse ox on a daily basis.  Severe sepsis (HCC) Present on admission with tachycardia and tachypnea, acute respiratory failure and acute kidney injury acute metabolic encephalopathy and multifocal pneumonia.  Multifocal pneumonia On Rocephin and Zithromax  COPD with acute exacerbation (HCC) Patient currently on prednisone  Acute kidney injury superimposed on CKD (HCC) Acute kidney injury on CKD stage II.  Creatinine peaked at 1.67 and down to 1.05 with a GFR greater than 60  Type 2 diabetes mellitus, without long-term current use of insulin (HCC) Patient on Lantus and NovoLog insulin with sliding scale.  History of ETOH abuse Patient on as needed Librium  Urinary retention Patient  stated he restarted Flomax.  Patient also on finasteride  Myocardial injury Secondary to pneumonia and acute respiratory failure.  Acute metabolic encephalopathy Improved        Subjective: Patient states that he has difficulty urinating but he restarted his Flomax at home and now doing better.  Came in with shortness of breath and hypoxia.  Patient states he has oxygen at home but he does not wear it.  Physical Exam: Vitals:   12/21/23 0226 12/21/23 0354 12/21/23 0829 12/21/23 1233  BP:  127/66 (!) 151/87 (!) 169/88  Pulse:  76 81 82  Resp:  20    Temp:  97.8 F (36.6 C) (!) 97.5 F (36.4 C) 97.9 F (36.6 C)  TempSrc:  Oral  Oral  SpO2: 95% 91% 91% 91%  Weight:      Height:       Physical Exam HENT:     Head: Normocephalic.     Mouth/Throat:     Pharynx: No oropharyngeal exudate.  Eyes:     General: Lids are normal.     Conjunctiva/sclera: Conjunctivae normal.  Cardiovascular:     Rate and Rhythm: Normal rate and regular rhythm.     Heart sounds: Normal heart sounds, S1 normal and S2 normal.  Pulmonary:     Breath sounds: Examination of the right-lower field reveals decreased breath sounds. Examination of the left-lower field reveals decreased breath sounds. Decreased breath sounds present. No wheezing, rhonchi or rales.  Abdominal:     Palpations: Abdomen is soft.     Tenderness: There is no abdominal tenderness.  Skin:  General: Skin is warm.     Findings: No rash.  Neurological:     Mental Status: He is alert and oriented to person, place, and time.     Data Reviewed: Creatinine 1.05, hemoglobin 11.5, white blood count 12.0, platelet count 384  Family Communication: Spoke with sister on the phone  Disposition: Status is: Inpatient Remains inpatient appropriate because: Patient desaturated on 5 L down to 81% and had to go up to 8 L initially and now back down to 4.  Planned Discharge Destination: Home with Home Health patient refused rehab    Time  spent: 28 minutes  Author: Alford Highland, MD 12/21/2023 3:21 PM  For on call review [email or web address].

## 2023-12-21 NOTE — Assessment & Plan Note (Signed)
 Improved

## 2023-12-21 NOTE — Progress Notes (Signed)
 Physical Therapy Treatment Patient Details Name: Wesley Wilson MRN: [id number] DOB: 1941-07-08 Today's Date: 12/21/2023   History of Present Illness presented to ER secondary to SOB, cough, wheezing; admitted for management of acute hypoxic respiratory failure secondary to CAP, COPD exacerbation.    PT Comments  Pt was long sitting in bed upon arrival on 5 L O2 [place]. Much improved O2 demands form earlier in admission. Pt is A and cooperative. Motivated to improve to return home "as soon as possible." Chartered loss adjuster educated pt on current O2 demands versus his baseline without need for O2. Pt was easily able to exit bed, stand 3 x EOB prior to ambulating around the room to recliner. Distance limited by pt desaturation. He gets SOB easily and overall fatigue with standing activity rapidly. Sao2 desaturates to 81% with ambulation on 5 L. Attempted top stay on 5 L o2 to recover however pt required increased O2 to 8L + extensive breathing techniques to eventually return to > 88%. After prolonged recovery, pt was able to wean back to 5L o2 and maintain > 90% at rest. Pt is progressing well. He will continue to benefit from skilled PT to maximize his independence and safety with all ADLs. Overall pt remains limited by poor activity tolerance.    If plan is discharge home, recommend the following: A little help with walking and/or transfers;A little help with bathing/dressing/bathroom     Equipment Recommendations  Rolling walker (2 wheels)       Precautions / Restrictions Precautions Precautions: Fall Recall of Precautions/Restrictions: Intact Restrictions Weight Bearing Restrictions Per Provider Order: No     Mobility  Bed Mobility Overal bed mobility: Needs Assistance Bed Mobility: Supine to Sit  Supine to sit: Modified independent (Device/Increase time)  General bed mobility comments: pt easily able to exit flat bed surface without physical assistance    Transfers Overall transfer level: Needs  assistance Equipment used: Rolling walker (2 wheels) Transfers: Sit to/from Stand Sit to Stand: Supervision  General transfer comment: Chartered loss adjuster adjusted RW to proper height prior to pt standing and ambulating. no Physical assistance to stand today.    Ambulation/Gait Ambulation/Gait assistance: Supervision Gait Distance (Feet): 15 Feet Assistive device: Rolling walker (2 wheels) Gait Pattern/deviations: Step-through pattern Gait velocity: WNL  General Gait Details: Pt was able to ambulate in room on 5 L o2 [place] however pt desaturates to 81%. Unable to recover to > 88%. O2 supply increased to 8L to recover however once recovered able to wean back to 5L o2 with sao2 > 91%    Balance Overall balance assessment: Needs assistance Sitting-balance support: No upper extremity supported, Feet supported Sitting balance-Leahy Scale: Good     Standing balance support: Bilateral upper extremity supported Standing balance-Leahy Scale: Good Standing balance comment: no LOB with BUE support on RW       Communication Communication Communication: No apparent difficulties  Cognition Arousal: Alert Behavior During Therapy: WFL for tasks assessed/performed   PT - Cognitive impairments: No apparent impairments    PT - Cognition Comments: Pt is A and O x 3. Agreeable to session and motivated to improve so he can return home at DC. pt off HFNC and currently on 5L standard O2 [place]. Following commands: Intact      Cueing Cueing Techniques: Verbal cues         Pertinent Vitals/Pain Pain Assessment Pain Assessment: No/denies pain     PT Goals (current goals can now be found in the care plan section) Acute Rehab PT Goals  Patient Stated Goal: go home at DC and have HH Progress towards PT goals: Progressing toward goals    Frequency    Min 2X/week       AM-PAC PT "6 Clicks" Mobility   Outcome Measure  Help needed turning from your back to your side while in a flat bed without using  bedrails?: None Help needed moving from lying on your back to sitting on the side of a flat bed without using bedrails?: None Help needed moving to and from a bed to a chair (including a wheelchair)?: None Help needed standing up from a chair using your arms (e.g., wheelchair or bedside chair)?: A Little Help needed to walk in hospital room?: A Little Help needed climbing 3-5 steps with a railing? : A Little 6 Click Score: 21    End of Session Equipment Utilized During Treatment: Oxygen (recquired 8L o2 [place] to recover to > 88% with activity. HR stayed below 100bpm with ambulation in room) Activity Tolerance: Patient tolerated treatment well;Patient limited by fatigue Patient left: in chair;with call bell/phone within reach Nurse Communication: Mobility status PT Visit Diagnosis: Muscle weakness (generalized) (M62.81);Difficulty in walking, not elsewhere classified (R26.2)     Time: 9604-5409 PT Time Calculation (min) (ACUTE ONLY): 23 min  Charges:    $Gait Training: 8-22 mins $Therapeutic Activity: 8-22 mins PT General Charges $$ ACUTE PT VISIT: 1 Visit                     Jetta Lout PTA 12/21/23, 10:04 AM

## 2023-12-21 NOTE — Assessment & Plan Note (Signed)
Completed antibiotics during the hospital course.

## 2023-12-21 NOTE — Hospital Course (Signed)
 83 y.o. male with medical history significant of type 2 diabetes, hypertension, stage III CKD, COPD, pacemaker placement presenting with acute respiratory failure with hypoxia, pneumonia, COPD exacerbation, encephalopathy.  History primarily from family in the setting of lethargy/encephalopathy.  Per report, family reports increased work of breathing, shortness of breath, cough wheezing over the past 2 to 3 days. ..." See H&P for full HPI on admission & ED course.   Pt was admitted with acute respiratory failure with hypoxia due to COPD with acute exacerbation and multi-focal pneumonia.  3/12.  Patient was tapered down to 4 L of oxygen.  With physical therapy on 5 L he did drop down to 81% pulse ox with ambulation. 3/13.  Patient held his saturations with physical therapy at 90% on 4 L with ambulation.  Patient does not want to wear oxygen at home.  Will reassess tomorrow. 3/14.  Patient desaturated on room air with exertion down to 84%.  Patient was able to hold his saturations while ambulating with 2 L at 91%.  Discussed at length that patient needs oxygen 2 L 24/7.  Will refer to pulmonary as outpatient.

## 2023-12-21 NOTE — Care Management Important Message (Signed)
 Important Message  Patient Details  Name: Wesley Wilson MRN: [id number] Date of Birth: [birth year]   Important Message Given:  Yes - Medicare IM     Cristela Blue, CMA 12/21/2023, 10:37 AM

## 2023-12-21 NOTE — Inpatient Diabetes Management (Signed)
 Inpatient Diabetes Program Recommendations  AACE/ADA: New Consensus Statement on Inpatient Glycemic Control   Target Ranges:  Prepandial:   less than 140 mg/dL      Peak postprandial:   less than 180 mg/dL (1-2 hours)      Critically ill patients:  140 - 180 mg/dL    Latest Reference Range & Units 12/20/23 04:50 12/20/23 08:20 12/20/23 18:20 12/20/23 20:11 12/20/23 23:51 12/21/23 04:02  Glucose-Capillary 70 - 99 mg/dL 425 (H) 956 (H) 387 (H) 331 (H) 309 (H) 149 (H)   Review of Glycemic Control  Diabetes history: DM2 Outpatient Diabetes medications: Trulicity 4.5 mg Qweek Current orders for Inpatient glycemic control: Lantus 18 units daily, Novolog 0-20 units Q4H, Novolog 3 units TID with meals; Prednisone 40 mg QAM   Inpatient Diabetes Program Recommendations:     Insulin: In reviewing chart, noted Lantus was NOT GIVEN on 3/11 (charted reason patient not available). As a result, CBGs up in 300's yesterday afternoon and evening. Do not recommend to make any adjustments with insulin regimen at this time.    NURSING: Please administer insulin as prescribed and if patient is unavailable during administration time of basal insulin, please retime Lantus administration time.  Thanks, Orlando Penner, RN, MSN, CDCES Diabetes Coordinator Inpatient Diabetes Program [phone] (Team Pager from 8am to 5pm)

## 2023-12-21 NOTE — Assessment & Plan Note (Signed)
 Patient states he has oxygen at home and weaned himself off in the past.  Patient tapered from high flow nasal cannula 7 L this morning down to 4 L.  With working with physical therapy on 5 L did desaturate to 81% with ambulation.  Continue to check pulse ox on a daily basis.

## 2023-12-21 NOTE — Plan of Care (Signed)

## 2023-12-21 NOTE — Assessment & Plan Note (Signed)
 Acute kidney injury on CKD stage II.  Creatinine peaked at 1.67 and down to 1.05 with a GFR greater than 60

## 2023-12-22 DIAGNOSIS — A419 Sepsis, unspecified organism: Secondary | ICD-10-CM | POA: Diagnosis not present

## 2023-12-22 DIAGNOSIS — J9621 Acute and chronic respiratory failure with hypoxia: Secondary | ICD-10-CM | POA: Diagnosis not present

## 2023-12-22 DIAGNOSIS — J189 Pneumonia, unspecified organism: Secondary | ICD-10-CM | POA: Diagnosis not present

## 2023-12-22 DIAGNOSIS — J441 Chronic obstructive pulmonary disease with (acute) exacerbation: Secondary | ICD-10-CM | POA: Diagnosis not present

## 2023-12-22 LAB — GLUCOSE, CAPILLARY
Glucose-Capillary: 116 mg/dL — ABNORMAL HIGH (ref 70–99)
Glucose-Capillary: 96 mg/dL (ref 70–99)
Glucose-Capillary: 123 mg/dL — ABNORMAL HIGH (ref 70–99)
Glucose-Capillary: 146 mg/dL — ABNORMAL HIGH (ref 70–99)
Glucose-Capillary: 229 mg/dL — ABNORMAL HIGH (ref 70–99)
Glucose-Capillary: 229 mg/dL — ABNORMAL HIGH (ref 70–99)

## 2023-12-22 LAB — LEGIONELLA PNEUMOPHILA SEROGP 1 UR AG: L. pneumophila Serogp 1 Ur Ag: NEGATIVE

## 2023-12-22 MED ORDER — INSULIN ASPART 100 UNIT/ML IJ SOLN
0.0000 [IU] | Freq: Three times a day (TID) | INTRAMUSCULAR | Status: DC
  Administered 2023-12-22: 5 [IU] via SUBCUTANEOUS
  Administered 2023-12-23: 2 [IU] via SUBCUTANEOUS
  Administered 2023-12-23: 3 [IU] via SUBCUTANEOUS
  Filled 2023-12-22: qty 1

## 2023-12-22 MED ORDER — PREDNISONE 20 MG PO TABS
20.0000 mg | ORAL_TABLET | Freq: Every day | ORAL | Status: DC
  Administered 2023-12-23: 20 mg via ORAL
  Filled 2023-12-22: qty 1

## 2023-12-22 MED ORDER — IPRATROPIUM-ALBUTEROL 0.5-2.5 (3) MG/3ML IN SOLN
3.0000 mL | Freq: Three times a day (TID) | RESPIRATORY_TRACT | Status: DC
  Administered 2023-12-22 – 2023-12-23 (×2): 3 mL via RESPIRATORY_TRACT
  Filled 2023-12-22 (×2): qty 3

## 2023-12-22 MED ORDER — INSULIN ASPART 100 UNIT/ML IJ SOLN
0.0000 [IU] | Freq: Every day | INTRAMUSCULAR | Status: DC
  Administered 2023-12-22: 2 [IU] via SUBCUTANEOUS
  Filled 2023-12-22: qty 1

## 2023-12-22 MED ORDER — INSULIN GLARGINE 100 UNIT/ML ~~LOC~~ SOLN
10.0000 [IU] | Freq: Every day | SUBCUTANEOUS | Status: DC
Start: 2023-12-23 — End: 2023-12-23
  Administered 2023-12-23: 10 [IU] via SUBCUTANEOUS
  Filled 2023-12-22: qty 0.1

## 2023-12-22 NOTE — Plan of Care (Signed)

## 2023-12-22 NOTE — TOC Progression Note (Addendum)
 Transition of Care [facility]) - Progression Note    Patient Details  Name: Wesley Wilson MRN: [id number] Date of Birth: 09-06-1941  Transition of Care [facility] CM/SW Contact  Truddie Hidden, RN Phone Number: 12/22/2023, 2:10 PM  Clinical Narrative:    Spoke with patient's sister, Amil Amen. She will be able to assist patient at home. Patient has home oxygen. Amil Amen is agreeable to [facility] and would prefer [facility]. Amil Amen will transport patient home at discharge.   Referral sent and accepted by Maralyn Sago from [facility].         Expected Discharge Plan and Services                                               Social Determinants of Health (SDOH) Interventions SDOH Screenings   Food Insecurity: No Food Insecurity (12/20/2023)  Housing: Low Risk  (12/21/2023)  Transportation Needs: No Transportation Needs (12/20/2023)  Utilities: Not At Risk (12/21/2023)  Financial Resource Strain: Low Risk  (04/24/2023)   Received from [facility]  Social Connections: Moderately Integrated (12/20/2023)  Tobacco Use: Medium Risk (12/18/2023)    Readmission Risk Interventions     No data to display

## 2023-12-22 NOTE — Plan of Care (Signed)

## 2023-12-22 NOTE — Progress Notes (Signed)
 Progress Note   [name] ZOX:[id number] DOB: [birth year] DOA: 12/18/2023     4 DOS: the patient was seen and examined on 12/22/2023   Brief hospital course: 83 y.o. male with medical history significant of type 2 diabetes, hypertension, stage III CKD, COPD, pacemaker placement presenting with acute respiratory failure with hypoxia, pneumonia, COPD exacerbation, encephalopathy.  History primarily from family in the setting of lethargy/encephalopathy.  Per report, family reports increased work of breathing, shortness of breath, cough wheezing over the past 2 to 3 days. ..." See H&P for full HPI on admission & ED course.   Pt was admitted with acute respiratory failure with hypoxia due to COPD with acute exacerbation and multi-focal pneumonia.  3/12.  Patient was tapered down to 4 L of oxygen.  With physical therapy on 5 L he did drop down to 81% pulse ox with ambulation. 3/13.  Patient held his saturations with physical therapy at 90% on 4 L with ambulation.  Patient does not want to wear oxygen at home.  Will reassess tomorrow.  Assessment and Plan: * Acute on chronic hypoxic respiratory failure (HCC) Patient states he has oxygen at home and weaned himself off in the past.  Patient tapered from high flow nasal cannula 7 L this down to 4 L on 3/12.  With working with physical therapy on 5 L did desaturate to 81% with ambulation on 3/12.  On 3/13 did hold saturations on 4 L at 90%.  Patient is insistent that he has not been wearing oxygen at home.  High likelihood that patient is going to require oxygen at home.  Recheck pulse ox tomorrow.  Severe sepsis (HCC) Present on admission with tachycardia and tachypnea, acute respiratory failure and acute kidney injury acute metabolic encephalopathy and multifocal pneumonia.  Multifocal pneumonia Will complete Rocephin and Zithromax today  COPD with acute exacerbation (HCC) Since patient still on oxygen we will do a prednisone taper for  tomorrow.  Acute kidney injury superimposed on CKD (HCC) Acute kidney injury on CKD stage II.  Creatinine peaked at 1.67 and down to 1.05 with a GFR greater than 60  Type 2 diabetes mellitus, without long-term current use of insulin (HCC) Patient on Lantus and NovoLog insulin with sliding scale.  Since prednisone will be tapered tomorrow I will decrease dose of Lantus.  History of ETOH abuse Patient on as needed Librium  Urinary retention Patient stated he restarted Flomax.  Patient also on finasteride  Myocardial injury Secondary to pneumonia and acute respiratory failure.  Acute metabolic encephalopathy Improved        Subjective: Patient did better today with physical therapy holding his saturations at 90% on 4 L.  Patient insistent that he is not to wear oxygen at home.  Will reassess tomorrow to see if we can get him off oxygen.  Patient admitted with acute respiratory failure multifocal pneumonia.  Physical Exam: Vitals:   12/22/23 0320 12/22/23 0324 12/22/23 0910 12/22/23 1241  BP: (!) 148/80  (!) 166/83 (!) 152/80  Pulse: 77  86 88  Resp: 20  16 16   Temp: (!) 97.5 F (36.4 C)   97.6 F (36.4 C)  TempSrc:      SpO2: 91% 92% 97% 96%  Weight:      Height:       Physical Exam HENT:     Head: Normocephalic.     Mouth/Throat:     Pharynx: No oropharyngeal exudate.  Eyes:     General: Lids are  normal.     Conjunctiva/sclera: Conjunctivae normal.  Cardiovascular:     Rate and Rhythm: Normal rate and regular rhythm.     Heart sounds: Normal heart sounds, S1 normal and S2 normal.  Pulmonary:     Breath sounds: Examination of the right-lower field reveals decreased breath sounds. Examination of the left-lower field reveals decreased breath sounds. Decreased breath sounds present. No wheezing, rhonchi or rales.  Abdominal:     Palpations: Abdomen is soft.     Tenderness: There is no abdominal tenderness.  Skin:    General: Skin is warm.     Findings: No rash.   Neurological:     Mental Status: He is alert and oriented to person, place, and time.     Data Reviewed: Last creatinine 1.05, last hemoglobin 11.5, white blood count 12.0 Family Communication: Updated patient's sister on the phone  Disposition: Status is: Inpatient Remains inpatient appropriate because: Patient did better today with ambulation and held his saturations at 90% while on 4 L.  The patient told me that he is not going to wear oxygen at home.  Will reassess tomorrow to see if we are able to get him off oxygen or not.  Planned Discharge Destination: Home    Time spent: 28 minutes  Author: Alford Highland, MD 12/22/2023 1:50 PM  For on call review [email or web address].

## 2023-12-22 NOTE — Progress Notes (Signed)
 Occupational Therapy Treatment Patient Details Name: Wesley Wilson MRN: [id number] DOB: March 28, 1941 Today's Date: 12/22/2023   History of present illness presented to ER secondary to SOB, cough, wheezing; admitted for management of acute hypoxic respiratory failure secondary to CAP, COPD exacerbation.   OT comments  Upon entering the room, pt supine in bed and agreeable to OT intervention. Pt performing supine >sit without assistance. Pt stands with supervision and use of RW to ambulate to bathroom. Pt on 2Ls via [place] and needing assistance to manage O2 line for safety. Pt demonstrates ability to perform toilet transfer without assistance. Pt performs hand hygiene and brushes teeth while standing at sink with supervision overall before returning to bed. Call bell and all needed items within reach upon exiting the room.       If plan is discharge home, recommend the following:  A little help with walking and/or transfers;A little help with bathing/dressing/bathroom;Assist for transportation;Help with stairs or ramp for entrance;Assistance with cooking/housework   Equipment Recommendations  BSC/3in1       Precautions / Restrictions Precautions Precautions: Fall Recall of Precautions/Restrictions: Intact Restrictions Weight Bearing Restrictions Per Provider Order: No       Mobility Bed Mobility Overal bed mobility: Modified Independent                  Transfers Overall transfer level: Needs assistance Equipment used: Rolling walker (2 wheels) Transfers: Sit to/from Stand Sit to Stand: Supervision                 Balance Overall balance assessment: Needs assistance Sitting-balance support: No upper extremity supported, Feet supported Sitting balance-Leahy Scale: Good     Standing balance support: Bilateral upper extremity supported Standing balance-Leahy Scale: Good                             ADL either performed or assessed with clinical judgement    ADL Overall ADL's : Needs assistance/impaired                                       General ADL Comments: supervision overall with RW    Extremity/Trunk Assessment Upper Extremity Assessment Upper Extremity Assessment: Overall WFL for tasks assessed   Lower Extremity Assessment Lower Extremity Assessment: Overall WFL for tasks assessed        Vision Patient Visual Report: No change from baseline           Communication Communication Communication: No apparent difficulties   Cognition Arousal: Alert Behavior During Therapy: WFL for tasks assessed/performed Cognition: No apparent impairments                               Following commands: Intact        Cueing   Cueing Techniques: Verbal cues             Pertinent Vitals/ Pain       Pain Assessment Pain Assessment: No/denies pain         Frequency  Min 1X/week        Progress Toward Goals  OT Goals(current goals can now be found in the care plan section)  Progress towards OT goals: Progressing toward goals  Acute Rehab OT Goals OT Goal Formulation: With patient Time For Goal Achievement: 01/03/24 Potential to Achieve Goals:  Good  Plan         AM-PAC OT "6 Clicks" Daily Activity     Outcome Measure   Help from another person eating meals?: None Help from another person taking care of personal grooming?: None Help from another person toileting, which includes using toliet, bedpan, or urinal?: A Little Help from another person bathing (including washing, rinsing, drying)?: None Help from another person to put on and taking off regular upper body clothing?: None Help from another person to put on and taking off regular lower body clothing?: A Little 6 Click Score: 22    End of Session Equipment Utilized During Treatment: Oxygen;Rolling walker (2 wheels)  OT Visit Diagnosis: Other abnormalities of gait and mobility (R26.89);Muscle weakness (generalized)  (M62.81);Unsteadiness on feet (R26.81)   Activity Tolerance Patient tolerated treatment well   Patient Left with call bell/phone within reach;in bed;with bed alarm set   Nurse Communication Mobility status        Time: 1610-9604 OT Time Calculation (min): 22 min  Charges: OT General Charges $OT Visit: 1 Visit OT Treatments $Self Care/Home Management : 8-22 mins  Jackquline Denmark, MS, OTR/L , CBIS ascom [phone]  12/22/23, 4:09 PM

## 2023-12-22 NOTE — Progress Notes (Signed)
 Physical Therapy Treatment Patient Details Name: Wesley Wilson MRN: [id number] DOB: 1941/10/04 Today's Date: 12/22/2023   History of Present Illness presented to ER secondary to SOB, cough, wheezing; admitted for management of acute hypoxic respiratory failure secondary to CAP, COPD exacerbation.    PT Comments  Pt was  long sitting in bed upon arrival. He remains on 4 L o2 throughout session. Pts resting sao2 96%. Pt easily and safely was able to exit bed, stand, and ambulate with use of RW. Much less shortness of breath versus previous date. Pt sao2 dropped to 90% on 4 L. Author elected not to wean form O2 due to desaturation to 90%. PT recommend continued skilled therapy at DC to maximize pt's independence, safety, and activity tolerance during all ADLs. Pt will most likely need home O2 at DC.    If plan is discharge home, recommend the following: A little help with walking and/or transfers;A little help with bathing/dressing/bathroom     Equipment Recommendations  Rolling walker (2 wheels)       Precautions / Restrictions Precautions Precautions: Fall Recall of Precautions/Restrictions: Intact Restrictions Weight Bearing Restrictions Per Provider Order: No     Mobility  Bed Mobility Overal bed mobility: Needs Assistance, Modified Independent Bed Mobility: Supine to Sit  Supine to sit: Supervision, Modified independent (Device/Increase time)   Transfers Overall transfer level: Needs assistance Equipment used: Rolling walker (2 wheels) Transfers: Sit to/from Stand Sit to Stand: Supervision   Ambulation/Gait Ambulation/Gait assistance: Supervision Gait Distance (Feet): 100 Feet Assistive device: Rolling walker (2 wheels) Gait Pattern/deviations: Step-through pattern Gait velocity: WNL  General Gait Details: no LOB or safety concerns during ambulation with use of RW. pt tolerated activity better today. he was on 4 L o2 throughout session. sao2 96% on at rest. did drop to 90  % during ambulation however pt does not endorse any symptoms. Pt mcu less SOB today versus previous date even with much more activity versus previous day.    Balance Overall balance assessment: Needs assistance Sitting-balance support: No upper extremity supported, Feet supported Sitting balance-Leahy Scale: Good     Standing balance support: Bilateral upper extremity supported Standing balance-Leahy Scale: Good Standing balance comment: no LOB with BUE support on RW       Communication Communication Communication: No apparent difficulties  Cognition Arousal: Alert Behavior During Therapy: WFL for tasks assessed/performed   PT - Cognitive impairments: No apparent impairments      Following commands: Intact      Cueing Cueing Techniques: Verbal cues     General Comments General comments (skin integrity, edema, etc.): pt sao2 > 90 % on 4 L. Slightly SOB with activity but is improving      Pertinent Vitals/Pain Pain Assessment Pain Assessment: No/denies pain     PT Goals (current goals can now be found in the care plan section) Acute Rehab PT Goals Patient Stated Goal: go home Progress towards PT goals: Progressing toward goals    Frequency    Min 2X/week       AM-PAC PT "6 Clicks" Mobility   Outcome Measure  Help needed turning from your back to your side while in a flat bed without using bedrails?: None Help needed moving from lying on your back to sitting on the side of a flat bed without using bedrails?: A Little Help needed moving to and from a bed to a chair (including a wheelchair)?: A Little Help needed standing up from a chair using your arms (e.g., wheelchair or  bedside chair)?: A Little Help needed to walk in hospital room?: A Little Help needed climbing 3-5 steps with a railing? : A Little 6 Click Score: 19    End of Session Equipment Utilized During Treatment: Oxygen (4 L [place]) Activity Tolerance: Patient tolerated treatment well;Patient limited  by fatigue Patient left: in chair;with call bell/phone within reach Nurse Communication: Mobility status PT Visit Diagnosis: Muscle weakness (generalized) (M62.81);Difficulty in walking, not elsewhere classified (R26.2)     Time: 4098-1191 PT Time Calculation (min) (ACUTE ONLY): 16 min  Charges:    $Gait Training: 8-22 mins PT General Charges $$ ACUTE PT VISIT: 1 Visit                     Jetta Lout PTA 12/22/23, 10:51 AM

## 2023-12-23 DIAGNOSIS — J189 Pneumonia, unspecified organism: Secondary | ICD-10-CM | POA: Diagnosis not present

## 2023-12-23 DIAGNOSIS — A419 Sepsis, unspecified organism: Secondary | ICD-10-CM | POA: Diagnosis not present

## 2023-12-23 DIAGNOSIS — J441 Chronic obstructive pulmonary disease with (acute) exacerbation: Secondary | ICD-10-CM | POA: Diagnosis not present

## 2023-12-23 DIAGNOSIS — J9621 Acute and chronic respiratory failure with hypoxia: Secondary | ICD-10-CM | POA: Diagnosis not present

## 2023-12-23 LAB — CULTURE, BLOOD (ROUTINE X 2)
Culture: NO GROWTH
Culture: NO GROWTH
Special Requests: ADEQUATE

## 2023-12-23 LAB — GLUCOSE, CAPILLARY
Glucose-Capillary: 138 mg/dL — ABNORMAL HIGH (ref 70–99)
Glucose-Capillary: 189 mg/dL — ABNORMAL HIGH (ref 70–99)

## 2023-12-23 MED ORDER — PREDNISONE 5 MG PO TABS: ORAL_TABLET | ORAL | 0 refills | Status: AC

## 2023-12-23 MED ORDER — BUDESONIDE-FORMOTEROL FUMARATE 80-4.5 MCG/ACT IN AERO: 2.0000 | INHALATION_SPRAY | Freq: Two times a day (BID) | RESPIRATORY_TRACT | 0 refills | Status: AC

## 2023-12-23 NOTE — Discharge Summary (Signed)
 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 male with medical history significant of type 2 diabetes, hypertension, stage III CKD, COPD, pacemaker placement presenting with acute respiratory failure with hypoxia, pneumonia, COPD exacerbation, encephalopathy.  History primarily from family in the setting of lethargy/encephalopathy.  Per report, family reports increased work of breathing, shortness of breath, cough wheezing over the past 2 to 3 days. ..." See H&P for full HPI on admission & ED course.   Pt was admitted with acute respiratory failure with hypoxia due to COPD with acute exacerbation and multi-focal pneumonia.  3/12.  Patient was tapered down to 4 L of oxygen.  With physical therapy on 5 L he did drop down to 81% pulse ox with ambulation. 3/13.  Patient held his saturations with physical therapy at 90% on 4 L with ambulation.  Patient does not want to wear oxygen at home.  Will reassess tomorrow. 3/14.  Patient desaturated on room air with exertion down to 84%.  Patient was able to hold his saturations while ambulating with 2 L at 91%.  Discussed at length that patient needs oxygen 2 L 24/7.  Will refer to pulmonary as outpatient.  Assessment and Plan: * Acute on chronic hypoxic  respiratory failure (HCC) Patient states he has oxygen at home and weaned himself off in the past.  Patient tapered from high flow nasal cannula 7 L this down to 4 L on 3/12.  With working with physical therapy on 5 L did desaturate to 81% with ambulation on 3/12.  On 3/13 did hold saturations on 4 L at 90%.  Patient on room air with ambulation dropped down to 84% but was able to all the saturations at 91% on 2 L.  Explained the importance of wearing oxygen 2 L nasal cannula 24/7 for now.  Will refer to pulmonary as outpatient.  Severe sepsis (HCC) Present on admission with tachycardia and tachypnea, acute respiratory failure and acute kidney injury acute metabolic encephalopathy and multifocal pneumonia.  Multifocal pneumonia Completed antibiotics during the hospital course  COPD exacerbation (HCC) Will continue a prednisone taper for few more days upon going home.  Acute kidney injury superimposed on CKD (HCC) Acute kidney injury on CKD stage II.  Creatinine peaked at 1.67 and down to 1.05 with a GFR greater than 60  Type 2 diabetes mellitus, without long-term current use of insulin (HCC) Patient was on Lantus and sliding scale during the hospital course while on steroids.  Can go back on Trulicity as outpatient.  History of ETOH abuse Patient on as needed Librium  Urinary retention Patient stated he restarted Flomax.  Patient also on finasteride  Myocardial injury Secondary to pneumonia and acute respiratory failure.  Acute metabolic encephalopathy Improved  Consultants: None Procedures performed: None Disposition: Home Diet recommendation:  Cardiac and Carb modified diet DISCHARGE MEDICATION: Allergies as of 12/23/2023   No Known Allergies      Medication List     STOP taking these medications    acetaminophen 500 MG tablet Commonly known as: TYLENOL   empagliflozin 25 MG Tabs tablet Commonly known as: Jardiance   lisinopril 40 MG tablet Commonly  known as: ZESTRIL   nystatin powder Commonly known as: MYCOSTATIN/NYSTOP       TAKE these medications    albuterol 108 (90 Base) MCG/ACT inhaler Commonly known as: VENTOLIN HFA Inhale 2 puffs into the lungs every 6 (six) hours as needed for wheezing or shortness of breath.   atorvastatin 20 MG tablet Commonly known as: LIPITOR Take 20 mg by mouth daily.   budesonide-formoterol 80-4.5 MCG/ACT inhaler Commonly known as: Symbicort Inhale 2 puffs into the lungs 2 (two) times daily.   chlordiazePOXIDE 10 MG capsule Commonly known as: LIBRIUM Take 10 mg by mouth daily as needed for anxiety.   diclofenac Sodium 1 % Gel Commonly known as: VOLTAREN Apply 2 g topically 4 (four) times daily.   finasteride 5 MG tablet Commonly known as: PROSCAR Take 1 tablet by mouth daily.   metoprolol succinate 25 MG 24 hr tablet Commonly known as: TOPROL-XL Take 1 tablet (25 mg total) by mouth daily.   predniSONE 5 MG tablet Commonly known as: DELTASONE 3 tabs po day 1; 2 tabs po day 2; 1 tab po day 3   senna-docusate 8.6-50 MG tablet Commonly known as: Senokot-S Take 2 tablets by mouth daily.   sertraline 25 MG tablet Commonly known as: ZOLOFT Take 25 mg by mouth daily.   tadalafil 5 MG tablet Commonly known as: CIALIS Take 5 mg by mouth daily.   tamsulosin 0.4 MG Caps capsule Commonly known as: FLOMAX TAKE TWO CAPSULES BY MOUTH EVERY EVENING   Trulicity 4.5 MG/0.5ML Soaj Generic drug: Dulaglutide SMARTSIG:4.5 Milligram(s) SUB-Q Once a Week               Durable Medical Equipment  (From admission, onward)           Start     Ordered   12/23/23 0927  For home use only DME oxygen  Once       Question Answer Comment  Length of Need Lifetime   Mode or (Route) Nasal cannula   Liters per Minute 2   Frequency Continuous (stationary and portable oxygen unit needed)   Oxygen conserving device Yes   Oxygen delivery system Gas      12/23/23 0926             Follow-up Information     Dortha Kern, MD Follow up in 5 day(s).   Specialty: Family Medicine Why: Follow up appointment: 12/28/2023 @ 8:30 AM Contact information: [address] [place] [address] [phone]         Vida Rigger, MD Follow up in 2 week(s).   Specialty: Pulmonary Disease Why: Please have patient call and set up an appointment Contact information: [address] [place] [address] [phone]                Discharge Exam: Ceasar Mons Weights   12/18/23 1043  Weight: 98.9 kg   Physical Exam HENT:     Head: Normocephalic.     Mouth/Throat:     Pharynx: No oropharyngeal exudate.  Eyes:     General: Lids are normal.  Conjunctiva/sclera: Conjunctivae normal.  Cardiovascular:     Rate and Rhythm: Normal rate and regular rhythm.     Heart sounds: Normal heart sounds, S1 normal and S2 normal.  Pulmonary:     Breath sounds: Examination of the right-lower field reveals decreased breath sounds. Examination of the left-lower field reveals decreased breath sounds. Decreased breath sounds present. No wheezing, rhonchi or rales.  Abdominal:     Palpations: Abdomen is soft.     Tenderness: There is no abdominal tenderness.  Skin:    General: Skin is warm.     Findings: No rash.  Neurological:     Mental Status: He is alert and oriented to person, place, and time.      Condition at discharge: stable  The results of significant diagnostics from this hospitalization (including imaging, microbiology, ancillary and laboratory) are listed below for reference.   Imaging Studies: DG Chest 1 View Result Date: 12/18/2023 CLINICAL DATA:  Dyspnea, sepsis EXAM: CHEST  1 VIEW COMPARISON:  05/03/2023 chest radiograph. FINDINGS: Stable 2 lead left subclavian pacemaker with lead tips overlying the right atrium and right ventricle. Stable cardiomediastinal silhouette with normal heart size. No pneumothorax. No significant effusions. Patchy right  greater than left parahilar lung consolidation. IMPRESSION: Patchy right greater than left parahilar lung consolidation, suspicious for multifocal pneumonia. Chest radiographic follow-up to resolution recommended. Electronically Signed   By: Delbert Phenix M.D.   On: 12/18/2023 11:04    Microbiology: Results for orders placed or performed during the hospital encounter of 12/18/23  Culture, blood (Routine x 2)     Status: None   Collection Time: 12/18/23 10:32 AM   Specimen: BLOOD  Result Value Ref Range Status   Specimen Description BLOOD RIGHT ANTECUBITAL  Final   Special Requests   Final    BOTTLES DRAWN AEROBIC AND ANAEROBIC Blood Culture adequate volume   Culture   Final    NO GROWTH 5 DAYS Performed at [facility], [address] [place], [place] [address]    Report Status 12/23/2023 FINAL  Final  Culture, blood (Routine x 2)     Status: None   Collection Time: 12/18/23 10:36 AM   Specimen: BLOOD RIGHT HAND  Result Value Ref Range Status   Specimen Description BLOOD RIGHT HAND  Final   Special Requests   Final    BOTTLES DRAWN AEROBIC AND ANAEROBIC Blood Culture results may not be optimal due to an inadequate volume of blood received in culture bottles   Culture   Final    NO GROWTH 5 DAYS Performed at [facility], [address] [place], [place] [address]    Report Status 12/23/2023 FINAL  Final  Resp panel by RT-PCR (RSV, Flu A&B, Covid) Anterior Nasal Swab     Status: None   Collection Time: 12/18/23 10:37 AM   Specimen: Anterior Nasal Swab  Result Value Ref Range Status   SARS Coronavirus 2 by RT PCR NEGATIVE NEGATIVE Final    Comment: (NOTE) SARS-CoV-2 target nucleic acids are NOT DETECTED.  The SARS-CoV-2 RNA is generally detectable in upper respiratory specimens during the acute phase of infection. The lowest concentration of SARS-CoV-2 viral copies this assay can detect is 138 copies/mL. A negative result does not preclude  SARS-Cov-2 infection and should not be used as the sole basis for treatment or other patient management decisions. A negative result may occur with  improper specimen collection/handling, submission of specimen other than nasopharyngeal swab, presence of viral mutation(s) within the areas  targeted by this assay, and inadequate number of viral copies(<138 copies/mL). A negative result must be combined with clinical observations, patient history, and epidemiological information. The expected result is Negative.  Fact Sheet for Patients:  BloggerCourse.com  Fact Sheet for Healthcare Providers:  SeriousBroker.it  This test is no t yet approved or cleared by the [place] FDA and  has been authorized for detection and/or diagnosis of SARS-CoV-2 by FDA under an Emergency Use Authorization (EUA). This EUA will remain  in effect (meaning this test can be used) for the duration of the COVID-19 declaration under Section 564(b)(1) of the Act, 21 U.S.C.section 360bbb-3(b)(1), unless the authorization is terminated  or revoked sooner.       Influenza A by PCR NEGATIVE NEGATIVE Final   Influenza B by PCR NEGATIVE NEGATIVE Final    Comment: (NOTE) The Xpert Xpress SARS-CoV-2/FLU/RSV plus assay is intended as an aid in the diagnosis of influenza from Nasopharyngeal swab specimens and should not be used as a sole basis for treatment. Nasal washings and aspirates are unacceptable for Xpert Xpress SARS-CoV-2/FLU/RSV testing.  Fact Sheet for Patients: BloggerCourse.com  Fact Sheet for Healthcare Providers: SeriousBroker.it  This test is not yet approved or cleared by the [place] FDA and has been authorized for detection and/or diagnosis of SARS-CoV-2 by FDA under an Emergency Use Authorization (EUA). This EUA will remain in effect (meaning this test can be used) for the duration of  the COVID-19 declaration under Section 564(b)(1) of the Act, 21 U.S.C. section 360bbb-3(b)(1), unless the authorization is terminated or revoked.     Resp Syncytial Virus by PCR NEGATIVE NEGATIVE Final    Comment: (NOTE) Fact Sheet for Patients: BloggerCourse.com  Fact Sheet for Healthcare Providers: SeriousBroker.it  This test is not yet approved or cleared by the [place] FDA and has been authorized for detection and/or diagnosis of SARS-CoV-2 by FDA under an Emergency Use Authorization (EUA). This EUA will remain in effect (meaning this test can be used) for the duration of the COVID-19 declaration under Section 564(b)(1) of the Act, 21 U.S.C. section 360bbb-3(b)(1), unless the authorization is terminated or revoked.  Performed at [facility], [address] [place], [place] [address]   Respiratory (~20 pathogens) panel by PCR     Status: None   Collection Time: 12/18/23  6:46 PM   Specimen: Nasopharyngeal Swab; Respiratory  Result Value Ref Range Status   Adenovirus NOT DETECTED NOT DETECTED Final   Coronavirus 229E NOT DETECTED NOT DETECTED Final    Comment: (NOTE) The Coronavirus on the Respiratory Panel, DOES NOT test for the novel  Coronavirus (2019 nCoV)    Coronavirus HKU1 NOT DETECTED NOT DETECTED Final   Coronavirus NL63 NOT DETECTED NOT DETECTED Final   Coronavirus OC43 NOT DETECTED NOT DETECTED Final   Metapneumovirus NOT DETECTED NOT DETECTED Final   Rhinovirus / Enterovirus NOT DETECTED NOT DETECTED Final   Influenza A NOT DETECTED NOT DETECTED Final   Influenza B NOT DETECTED NOT DETECTED Final   Parainfluenza Virus 1 NOT DETECTED NOT DETECTED Final   Parainfluenza Virus 2 NOT DETECTED NOT DETECTED Final   Parainfluenza Virus 3 NOT DETECTED NOT DETECTED Final   Parainfluenza Virus 4 NOT DETECTED NOT DETECTED Final   Respiratory Syncytial Virus NOT DETECTED NOT DETECTED Final   Bordetella  pertussis NOT DETECTED NOT DETECTED Final   Bordetella Parapertussis NOT DETECTED NOT DETECTED Final   Chlamydophila pneumoniae NOT DETECTED NOT DETECTED Final   Mycoplasma pneumoniae NOT DETECTED NOT DETECTED  Final    Comment: Performed at [facility], [address] [place], [place] [address]    Labs: CBC: Recent Labs  Lab 12/18/23 1035 12/19/23 0757 12/20/23 0445 12/21/23 0459  WBC 10.7* 11.2* 15.1* 12.0*  NEUTROABS 8.7*  --   --   --   HGB 11.4* 10.6* 10.8* 11.5*  HCT 34.4* 31.4* 32.6* 34.1*  MCV 88.2 89.5 90.8 89.3  PLT 212 235 313 384   Basic Metabolic Panel: Recent Labs  Lab 12/18/23 1035 12/19/23 0757 12/20/23 0445 12/21/23 0459  NA 131* 128* 136 138  K 3.8 4.3 4.4 4.5  CL 100 99 105 106  CO2 23 21* 23 25  GLUCOSE 296* 542* 188* 152*  BUN 45* 49* 44* 37*  CREATININE 1.56* 1.67* 1.31* 1.05  CALCIUM 7.8* 8.0* 8.2* 8.4*  MG  --   --  2.8*  --    Liver Function Tests: Recent Labs  Lab 12/18/23 1035 12/19/23 0757  AST 19 17  ALT 39 33  ALKPHOS 72 71  BILITOT 1.1 0.7  PROT 6.3* 6.0*  ALBUMIN 2.6* 2.5*   CBG: Recent Labs  Lab 12/22/23 1247 12/22/23 1642 12/22/23 2112 12/23/23 0857 12/23/23 1323  GLUCAP 146* 229* 229* 138* 189*    Discharge time spent: greater than 30 minutes.  Signed: Alford Highland, MD [facility] 12/23/2023

## 2023-12-23 NOTE — Plan of Care (Signed)

## 2023-12-23 NOTE — Progress Notes (Addendum)
 Pulse oximetry on room air is 93%. Pulse oximetry on room air with exertion is 84%. Pulse oximetry on 2L of oxygen via nasal cannula while ambulating is 91%.

## 2023-12-23 NOTE — TOC Transition Note (Addendum)
 Transition of Care [facility]) - Discharge Note   Patient Details  Name: Wesley Wilson MRN: [id number] Date of Birth: 1940-12-10  Transition of [facility]) CM/SW Contact:  Truddie Hidden, RN Phone Number: 12/23/2023, 10:42 AM   Clinical Narrative:    Patient needs home oxygen for discharge. Per Mitch at [employer], patient was set up with home oxygen July/2024 with a concentrator and home filled system.  Spoke with patient and [name]. She will transport patient home. She stated patient's tanks are unable to be filled due to piece being missing.  11:43pm Spoke with Mitch from [employer]. A loaner tank will be provided for discharge and a representative will follow up to service home system.   Sarah from [facility] notified of discharge.  12:03pm Spoke with Wesley Wilson to advise a loaner tank would be provided by [employer], and patient's home system serviced. She was also advised [facility] would contact her regarding scheduling for [facility].   TOC signing off.             Patient Goals and CMS Choice            Discharge Placement                       Discharge Plan and Services Additional resources added to the After Visit Summary for                                       Social Drivers of Health (SDOH) Interventions SDOH Screenings   Food Insecurity: No Food Insecurity (12/20/2023)  Housing: Low Risk  (12/21/2023)  Transportation Needs: No Transportation Needs (12/20/2023)  Utilities: Not At Risk (12/21/2023)  Financial Resource Strain: Low Risk  (04/24/2023)   Received from [facility]  Social Connections: Moderately Integrated (12/20/2023)  Tobacco Use: Medium Risk (12/18/2023)     Readmission Risk Interventions     No data to display

## 2023-12-23 NOTE — Progress Notes (Signed)
 Discharge instructions and teaching provided. Patient verbalized and demonstrated understanding of the provided instructions. All outstanding questions resolved. L arm PIV removed. Cannula intact. Pt tolerated well. All belongings packed and in tow.

## 2023-12-27 LAB — BLOOD GAS, VENOUS
Acid-Base Excess: 0.3 mmol/L (ref 0.0–2.0)
Bicarbonate: 25.4 mmol/L (ref 20.0–28.0)
O2 Saturation: 37 %
Patient temperature: 37
pCO2, Ven: 42 mmHg — ABNORMAL LOW (ref 44–60)
pH, Ven: 7.39 (ref 7.25–7.43)

## 2024-02-28 ENCOUNTER — Encounter (INDEPENDENT_AMBULATORY_CARE_PROVIDER_SITE_OTHER): Payer: Self-pay

## 2024-11-01 ENCOUNTER — Other Ambulatory Visit: Payer: Self-pay | Admitting: Family Medicine

## 2024-11-01 DIAGNOSIS — I639 Cerebral infarction, unspecified: Secondary | ICD-10-CM

## 2024-11-01 DIAGNOSIS — R531 Weakness: Secondary | ICD-10-CM
